# Patient Record
Sex: Male | Born: 1965 | Race: White | Hispanic: No | Marital: Married | Smoking: Current every day smoker
Health system: Southern US, Community
[De-identification: ages and names within clinical notes are randomized; demographics above are authoritative.]

## PROBLEM LIST (undated history)

## (undated) DIAGNOSIS — E785 Hyperlipidemia, unspecified: Secondary | ICD-10-CM

## (undated) DIAGNOSIS — F411 Generalized anxiety disorder: Secondary | ICD-10-CM

## (undated) DIAGNOSIS — Z87891 Personal history of nicotine dependence: Secondary | ICD-10-CM

## (undated) DIAGNOSIS — I498 Other specified cardiac arrhythmias: Secondary | ICD-10-CM

## (undated) DIAGNOSIS — I2510 Atherosclerotic heart disease of native coronary artery without angina pectoris: Secondary | ICD-10-CM

## (undated) DIAGNOSIS — M1990 Unspecified osteoarthritis, unspecified site: Secondary | ICD-10-CM

## (undated) DIAGNOSIS — E669 Obesity, unspecified: Secondary | ICD-10-CM

## (undated) DIAGNOSIS — M545 Low back pain: Secondary | ICD-10-CM

## (undated) DIAGNOSIS — K589 Irritable bowel syndrome without diarrhea: Secondary | ICD-10-CM

## (undated) DIAGNOSIS — Z9289 Personal history of other medical treatment: Secondary | ICD-10-CM

## (undated) HISTORY — DX: Irritable bowel syndrome, unspecified: K58.9

## (undated) HISTORY — DX: Atherosclerotic heart disease of native coronary artery without angina pectoris: I25.10

## (undated) HISTORY — DX: Unspecified osteoarthritis, unspecified site: M19.90

## (undated) HISTORY — DX: Low back pain: M54.5

## (undated) HISTORY — DX: Other specified cardiac arrhythmias: I49.8

## (undated) HISTORY — DX: Generalized anxiety disorder: F41.1

## (undated) HISTORY — PX: OTHER SURGICAL HISTORY: SHX169

---

## 2005-05-28 ENCOUNTER — Ambulatory Visit: Payer: Self-pay | Admitting: Pulmonary Disease

## 2007-06-02 ENCOUNTER — Ambulatory Visit: Payer: Self-pay | Admitting: Pulmonary Disease

## 2007-06-02 ENCOUNTER — Encounter: Payer: Self-pay | Admitting: Adult Health

## 2007-06-23 ENCOUNTER — Telehealth: Payer: Self-pay | Admitting: Adult Health

## 2007-06-23 DIAGNOSIS — K589 Irritable bowel syndrome without diarrhea: Secondary | ICD-10-CM

## 2007-06-23 DIAGNOSIS — F411 Generalized anxiety disorder: Secondary | ICD-10-CM

## 2007-06-23 DIAGNOSIS — M545 Low back pain: Secondary | ICD-10-CM | POA: Insufficient documentation

## 2007-06-23 DIAGNOSIS — K219 Gastro-esophageal reflux disease without esophagitis: Secondary | ICD-10-CM

## 2007-06-24 ENCOUNTER — Ambulatory Visit: Payer: Self-pay | Admitting: Pulmonary Disease

## 2007-08-13 ENCOUNTER — Ambulatory Visit (HOSPITAL_BASED_OUTPATIENT_CLINIC_OR_DEPARTMENT_OTHER): Admission: RE | Admit: 2007-08-13 | Discharge: 2007-08-13 | Payer: Self-pay | Admitting: Pulmonary Disease

## 2007-08-13 ENCOUNTER — Ambulatory Visit: Payer: Self-pay | Admitting: Pulmonary Disease

## 2007-08-26 ENCOUNTER — Ambulatory Visit: Payer: Self-pay | Admitting: Pulmonary Disease

## 2007-08-27 ENCOUNTER — Telehealth: Payer: Self-pay | Admitting: Pulmonary Disease

## 2007-09-03 ENCOUNTER — Ambulatory Visit: Payer: Self-pay | Admitting: Pulmonary Disease

## 2008-07-12 ENCOUNTER — Observation Stay (HOSPITAL_COMMUNITY): Admission: EM | Admit: 2008-07-12 | Discharge: 2008-07-13 | Payer: Self-pay | Admitting: Emergency Medicine

## 2008-07-12 ENCOUNTER — Telehealth (INDEPENDENT_AMBULATORY_CARE_PROVIDER_SITE_OTHER): Payer: Self-pay | Admitting: *Deleted

## 2008-07-16 ENCOUNTER — Telehealth: Payer: Self-pay | Admitting: Pulmonary Disease

## 2008-07-22 ENCOUNTER — Ambulatory Visit: Payer: Self-pay | Admitting: Pulmonary Disease

## 2008-07-25 DIAGNOSIS — M1990 Unspecified osteoarthritis, unspecified site: Secondary | ICD-10-CM | POA: Insufficient documentation

## 2008-07-25 DIAGNOSIS — G479 Sleep disorder, unspecified: Secondary | ICD-10-CM

## 2008-07-25 DIAGNOSIS — J209 Acute bronchitis, unspecified: Secondary | ICD-10-CM

## 2008-07-25 DIAGNOSIS — R079 Chest pain, unspecified: Secondary | ICD-10-CM | POA: Insufficient documentation

## 2008-07-25 DIAGNOSIS — F17200 Nicotine dependence, unspecified, uncomplicated: Secondary | ICD-10-CM

## 2010-06-08 DIAGNOSIS — M545 Low back pain, unspecified: Secondary | ICD-10-CM

## 2010-06-08 HISTORY — DX: Low back pain, unspecified: M54.50

## 2010-07-07 ENCOUNTER — Inpatient Hospital Stay (HOSPITAL_COMMUNITY)
Admission: EM | Admit: 2010-07-07 | Discharge: 2010-07-12 | Payer: Self-pay | Source: Home / Self Care | Attending: Cardiology | Admitting: Cardiology

## 2010-07-08 ENCOUNTER — Encounter: Payer: Self-pay | Admitting: Cardiology

## 2010-07-09 HISTORY — PX: CORONARY STENT PLACEMENT: SHX1402

## 2010-07-12 LAB — CBC
HCT: 41.7 % (ref 39.0–52.0)
Hemoglobin: 14.3 g/dL (ref 13.0–17.0)
MCH: 30.3 pg (ref 26.0–34.0)
MCHC: 34.3 g/dL (ref 30.0–36.0)
MCV: 88.3 fL (ref 78.0–100.0)
Platelets: 202 10*3/uL (ref 150–400)
RBC: 4.72 MIL/uL (ref 4.22–5.81)
RDW: 12.6 % (ref 11.5–15.5)
WBC: 10.1 10*3/uL (ref 4.0–10.5)

## 2010-07-12 LAB — BASIC METABOLIC PANEL
BUN: 7 mg/dL (ref 6–23)
CO2: 27 mEq/L (ref 19–32)
Calcium: 9.3 mg/dL (ref 8.4–10.5)
Chloride: 105 mEq/L (ref 96–112)
Creatinine, Ser: 1.14 mg/dL (ref 0.4–1.5)
GFR calc Af Amer: >60 mL/min (ref >60)
GFR calc non Af Amer: >60 mL/min (ref >60)
Glucose, Bld: 100 mg/dL — ABNORMAL HIGH (ref 70–99)
Potassium: 4.2 mEq/L (ref 3.5–5.1)
Sodium: 140 mEq/L (ref 135–145)

## 2010-07-12 LAB — HEPARIN LEVEL (UNFRACTIONATED): Heparin Unfractionated: <0.1 IU/mL — ABNORMAL LOW (ref 0.30–0.70)

## 2010-07-27 ENCOUNTER — Ambulatory Visit
Admission: RE | Admit: 2010-07-27 | Discharge: 2010-07-27 | Payer: Self-pay | Source: Home / Self Care | Attending: Physician Assistant | Admitting: Physician Assistant

## 2010-07-27 ENCOUNTER — Encounter: Payer: Self-pay | Admitting: Cardiovascular Disease

## 2010-07-27 ENCOUNTER — Telehealth: Payer: Self-pay | Admitting: Pulmonary Disease

## 2010-07-27 DIAGNOSIS — I498 Other specified cardiac arrhythmias: Secondary | ICD-10-CM

## 2010-07-27 DIAGNOSIS — I252 Old myocardial infarction: Secondary | ICD-10-CM | POA: Insufficient documentation

## 2010-07-27 DIAGNOSIS — I2510 Atherosclerotic heart disease of native coronary artery without angina pectoris: Secondary | ICD-10-CM | POA: Insufficient documentation

## 2010-07-27 HISTORY — DX: Other specified cardiac arrhythmias: I49.8

## 2010-08-01 ENCOUNTER — Telehealth: Payer: Self-pay | Admitting: Cardiology

## 2010-08-02 NOTE — Discharge Summary (Addendum)
NAME:  George [name], George [name] NO.:  [id number]  MEDICAL RECORD NO.:  [id number]          PATIENT TYPE:  INP  LOCATION:  6523                         FACILITY:  [facility]  PHYSICIAN:  Verne Carrow, MDDATE OF BIRTH:  1966-07-03  DATE OF ADMISSION:  07/07/2010 DATE OF DISCHARGE:  07/12/2010                              DISCHARGE SUMMARY   PRIMARY CARDIOLOGIST:  [name], MD  DISCHARGE DIAGNOSIS:  Non-ST-segment elevation myocardial infarction.  SECONDARY DIAGNOSES: 1. Coronary artery disease status post drug-eluting stent placement to     the mid left circumflex this admission. 2. Hyperlipidemia. 3. Ongoing tobacco abuse. 4. Degenerative disk disease with chronic low back pain. 5. History of bilateral knee surgeries.  ALLERGIES:  TETANUS TOXOID.  PROCEDURES: 1. Left heart cardiac catheterization revealing 60% mid LAD stenosis,     99% mid left circumflex stenosis, 60% to 70% distal circumflex     stenosis, 60% stenosis in the right posterolateral branch (1.75-mm     vessel).  The circumflex was stented with a 2.5- x 20-mm Promus     Element Plus drug-eluting stent.  EF was 55%. 2. July 08, 2010, 2-D echocardiogram, EF 60% to 65% with normal     wall motion and moderate LVH.  Normal valvular function.  HISTORY OF PRESENT ILLNESS:  A 45 year old male with prior history of chest pain and negative stress testing in January 2010 who was in his usual state of health until the evening of July 07, 2010, when he had sudden onset of left-sided substernal chest discomfort with radiation down the left arm.  He believes symptoms were initially made better by over-the-counter antacid but then had recurrence of symptoms prompting presentation to the [facility] ED.  In the ED, he was found to have a troponin of 0.22, although ECG showed no acute changes.  He was admitted for further evaluation and management of non-ST-elevation MI.  HOSPITAL COURSE:  The  patient was loaded with 600 mg of Plavix and placed on 75 mg daily as well as aspirin, beta-blocker, heparin, and statin therapy.  The patient eventually peaked his CK at 883, MB at 140.4, and troponin-I at 13.20.  On the evening of July 09, 2009, the patient complained of severe low back pain.  Internal Medicine was consulted for recommendation and they recommended CT of the lumbar spine which showed mild multilevel degenerative changes, most severe at T12-L1.  There was severe facet arthropathy noted at the right L4-L5.  Internal Medicine recommended continued pain management and possible surgical referral once cardiology issues were cleared.  Back pain improved with pain medications including Vicodin and Robaxin. The patient was taken to the cath lab on July 11, 2010, where he underwent diagnostic catheterization revealing 99% stenosis of the mid left circumflex.  He had moderate diffuse nonobstructive disease in the LAD, circumflex, and a small branch of the right coronary artery also. EF was 55%.  It was felt that the circumflex was the culprit vessel and this was successfully treated with a 2.5- x 20-mm Promus Element Plus drug-eluting stent.  The patient tolerated this procedure well and postprocedure has  not had any recurrent symptoms or limitations.  He has been counseled on the importance of smoking cessation and has been ambulating without difficulty.  Given residual LAD disease of 60% in the mid LAD, we do recommend outpatient Myoview in approximately 2-3 months to assess for possible ischemic burden.  The patient will be discharged home today in good condition.  DISCHARGE LABORATORY DATA:  Hemoglobin 14.3, hematocrit 41.7, WBC 10.1, platelets 202,000, INR 0.91.  Sodium 140, potassium 4.2, chloride 105, CO2 27, BUN 7, creatinine 0.14, glucose 100, calcium 9.3, magnesium 2.3. Hemoglobin A1c 5.5.  CK 590, MB 75.9, troponin-I 9.57.  Total cholesterol 194, triglycerides  210, HDL 33, LDL 119.  TSH 1.639.  MRSA screen was negative.  DISPOSITION:  The patient will be discharged home today in good condition.  FOLLOWUP PLANS AND APPOINTMENTS:  We have arranged for follow up with Tereso Newcomer, PA, in our [facility] office on July 26, 2009, at 10:30 a.m.  He will follow up with Dr. Shirlee Latch in approximately 3 months.  DISCHARGE MEDICATIONS: 1. Aspirin 325 mg daily. 2. Effient 10 mg daily. 3. Hydrocodone/APAP 5/325 mg q.8 h. p.r.n. 4. Lopressor 25 mg b.i.d. 5. Robaxin 500 mg q.i.d. 6. Nitroglycerin 0.4 mg sublingual p.r.n. chest pain. 7. Simvastatin 40 mg at bedtime.  OUTSTANDING LABORATORY DATA AND STUDIES:  Lexiscan Myoview recommended in 2-3 months to determine ischemic burden of LAD disease.  Follow up lipids LFTs in 6-8 weeks given new statin therapy.  DURATION OF DISCHARGE ENCOUNTER:  Forty five minutes including physician time.     Nicolasa Ducking, ANP   ______________________________ Verne Carrow, MD    CB/MEDQ  D:  07/12/2010  T:  07/12/2010  Job:  [id number]  Electronically Signed by Nicolasa Ducking ANP on 07/31/2010 03:42:58 PM Electronically Signed by Verne Carrow MD on 08/02/2010 04:10:09 PM

## 2010-08-10 NOTE — Assessment & Plan Note (Addendum)
Summary: eph per chris/lg   History of Present Illness: Primary Cardiologist:  Dr. [name]  Del Overfelt is a 45 yo male smoker with a h/o hyperlipidemia and sleep apnea and a negative myoview in 2010 who presented to [facility] 07/07/10 with chest pain.  He ruled in for NSTEMI.  Cath demonstrated a 99% CFX lesion that was treated with a DES.  Cath also demonstrated pLAD 60% stenosis.  It is rec. the patient have a follow up myoview in 2-3 mos. to assess for ischemia in the LAD territory.  He did have a CT scan done in the hospital due to complaints of back pain and this demonstrated multilevel DDD and DJD and he was treated with pain medications.  He is doing well.  He denies exertional chest pain or dyspnea.  He does take alprazolam for anxiety.  He's been added that for a while now.  He does note that if he gets anxious or upset he will get chest pressure.  Prior to his heart attack, he was having dyspnea with exertion.  This is all resolved.  He denies orthopnea or PND.  He denies pedal edema.  Denies syncope or palpitations.  He is taking his medications.  Current Medications (verified): 1)  Adult Aspirin Low Strength 81 Mg Tbdp (Aspirin) .... Take 1 Tablet By Mouth Once A Day 2)  Nitrostat 0.4 Mg Subl (Nitroglycerin) .... As Needed For Chest Pain 3)  Effient 10 Mg Tabs (Prasugrel Hcl) .Marland Kitchen.. 1 Once Daily 4)  Lopressor 50 Mg Tabs (Metoprolol Tartrate) .... 1/2 Tab Two Times A Day 5)  Robaxin 500 Mg Tabs (Methocarbamol) .Marland Kitchen.. 1 Tab Four Times A Day 6)  Simvastatin 40 Mg Tabs (Simvastatin) .Marland Kitchen.. 1 Once Daily 7)  Hydrocodone-Acetaminophen 5-325 Mg Tabs (Hydrocodone-Acetaminophen) .Marland Kitchen.. 1 Q8 Hours As Needed For Pain  Allergies (verified): 1)  ! * Anaprox 2)  ! * Tetanus  Past History:  Past Medical History: CAD  a. s/p NSTEMI 06/2010: tx with DES to CFX  b. cath 07/11/10: pLAD 60% (needs myoview to assess for isch); pCFX 30%, m99% (PCI) then 60%;       PLB 60% (small); EF 55% Echo 06/2010: EF  60-65%; mod LVH SLEEP DISORDER (ICD-780.50) Hx of ASTHMATIC BRONCHITIS, ACUTE (ICD-466.0) CIGARETTE SMOKER (ICD-305.1) Hx of CHEST PAIN (ICD-786.50) GERD (ICD-530.81) IRRITABLE BOWEL SYNDROME (ICD-564.1) DEGENERATIVE JOINT DISEASE (ICD-715.90) BACK PAIN, LUMBAR (ICD-724.2) ANXIETY (ICD-300.00)  Review of Systems       As per  the HPI.  All other systems reviewed and negative.   Vital Signs:  Patient profile:   45 year old male Height:      72 inches Weight:      229.75 pounds BMI:     31.27 Pulse rate:   43 / minute BP sitting:   116 / 82  (right arm) Cuff size:   regular  Vitals Entered By: Micki Riley CNA (July 27, 2010 11:50 AM)  Physical Exam  General:  Well nourished, well developed, in no acute distress HEENT: normal Neck: no JVD Cardiac:  normal S1, S2; RRR; no murmur Lungs:  clear to auscultation bilaterally, no wheezing, rhonchi or rales Abd: soft, nontender, no hepatomegaly Ext: no edema; R radial site without hematoma or bruit Vascular: no carotid  bruits Skin: warm and dry Neuro:  CNs 2-12 intact, no focal abnormalities noted    EKG  Procedure date:  07/27/2010  Findings:      sinus bradycardia Heart rate 43 Leftward axis Incomplete right bundle  branch block No ischemic changes  Impression & Recommendations:  Problem # 1:  MYOCARDIAL INFARCTION, ACUTE, SUBENDOCARDIAL (ICD-410.70) He is doing well post myocardial infarction.  He will continue on aspirin and Effient.  He is bradycardic with the metoprolol and I will decrease this to 12.5 mg twice a day.  He is unable to attend cardiac rehabilitation due to finances.  I've encouraged him to continue to increase his walking at home.  Problem # 2:  CORONARY ATHEROSCLEROSIS NATIVE CORONARY ARTERY (ICD-414.01) He notes that he is having some discomfort with anxiety.  I've encouraged him to get a refill for his alprazolam with Dr. Kriste Basque.  I reviewed his catheterization with Dr. Clifton Ranny who did his  intervention.  At this point, he felt that we should keep an eye on his 60% LAD lesion.  It did not appear to be critical at his cardiac catheterization.  The patient will be continued on medical therapy and we'll bring him back in 2 months to see Dr. Shirlee Latch.  Timing of when to proceed with his Myoview study will be decided on at that visit.  Orders: EKG w/ Interpretation (93000)  Problem # 3:  HYPERLIPIDEMIA (ICD-272.4) Check lipids and LFTs in 6-8 weeks.  Problem # 4:  BRADYCARDIA (ICD-427.89) He is fairly asymptomatic.  However his heart rates are in the 40s.  I have decreased his Lopressor to 12.5 mg  Problem # 5:  ANXIETY (ICD-300.00) F/u with Dr. Kriste Basque.  Problem # 6:  BACK PAIN, LUMBAR (ICD-724.2) F/u with Dr.  Kriste Basque.  Patient Instructions: 1)  Your physician recommends that you schedule a follow-up appointment in: 2 MONTHS WITH DR [facility] 2)  Your physician recommends that you return for lab work in: 6-8 WEEKS FASTING  LIPID LIVER 272.4 V58.69 3)  Your physician has recommended you make the following change in your medication: DECREASE METORPOLOL TO 12.5 MG two times a day  4)  CALL DR NADEL'S OFFICE TO GET REFILL ON GEN XANAX 5)  CONTINUE  EFFIENT  AND ASPIRIN

## 2010-08-10 NOTE — Progress Notes (Signed)
Summary: pt has med questions  Phone Note Call from Patient   Caller: Patient [phone] Reason for Call: Talk to Nurse Summary of Call: pt has questions re med Initial call taken by: Glynda Jaeger,  August 01, 2010 10:06 AM  Follow-up for Phone Call        Pt. would like to know if he needs to take 81 mg or 325 mg asprin daily. Pt. was D/c from the hospital with 325 mg asprin once a day. Tereso Newcomer aware he recommends for pt. to continue taken 325 mg asprin until 08/12/10 then start 81 mg there after. Pt. to continue taken Effient. Pt. aware. He verbalized understanding. Follow-up by: Ollen Gross, RN, BSN,  August 01, 2010 10:59 AM

## 2010-08-10 NOTE — Progress Notes (Signed)
Summary: rx request  Phone Note Call from Patient Call back at Home Phone [phone]   Caller: George [name] Call For: Aniyiah Zell Summary of Call: wants rx for xanax. [facility] in [place] [phone] Initial call taken by: Tivis Ringer, CNA,  July 27, 2010 2:05 PM  Follow-up for Phone Call        called and spoke with pt---he stated that he recently had MI---he is requesting refills of the xanax 0.25mg ---i didnt see this on his list but he stated that SN has given this med to him before---he is feeling very stressed about not being able to work right now---please advise if ok to call in med.  thanks Randell Loop CMA  July 27, 2010 2:16 PM    last ov- with SN---07/2008  Additional Follow-up for Phone Call Additional follow up Details #1::        per SN---ok to call in the alprazolam 0.25mg   #100  take one tablet by mouth three times a day as needed for nerves with no refills.   pt will need ov for further refills--called and spoke with pt and he is aware of meds sent to the pharmacy---he will call back for appt Randell Loop CMA  July 27, 2010 3:42 PM     New/Updated Medications: ALPRAZOLAM 0.25 MG TABS (ALPRAZOLAM) take one tablet by mouth three times a day as needed for nerves Prescriptions: ALPRAZOLAM 0.25 MG TABS (ALPRAZOLAM) take one tablet by mouth three times a day as needed for nerves  #100 x 0   Entered by:   Randell Loop CMA   Authorized by:   Michele Mcalpine MD   Signed by:   Randell Loop CMA on 07/27/2010   Method used:   Telephoned to ...       [facility]  [address] [facility] (retail)       [address]       [place], [place]  [address]       Ph: [phone]       Fax: [phone]   RxID:   [id number]

## 2010-09-14 ENCOUNTER — Other Ambulatory Visit: Payer: Self-pay | Admitting: Physician Assistant

## 2010-09-14 ENCOUNTER — Encounter: Payer: Self-pay | Admitting: Physician Assistant

## 2010-09-14 ENCOUNTER — Other Ambulatory Visit (INDEPENDENT_AMBULATORY_CARE_PROVIDER_SITE_OTHER): Payer: Self-pay

## 2010-09-14 DIAGNOSIS — I2510 Atherosclerotic heart disease of native coronary artery without angina pectoris: Secondary | ICD-10-CM

## 2010-09-14 DIAGNOSIS — R079 Chest pain, unspecified: Secondary | ICD-10-CM

## 2010-09-14 DIAGNOSIS — E785 Hyperlipidemia, unspecified: Secondary | ICD-10-CM

## 2010-09-14 LAB — HEPATIC FUNCTION PANEL
ALT: 36 U/L (ref 0–53)
AST: 19 U/L (ref 0–37)
Albumin: 4.1 g/dL (ref 3.5–5.2)
Alkaline Phosphatase: 81 U/L (ref 39–117)
Bilirubin, Direct: 0.2 mg/dL (ref 0.0–0.3)
Total Bilirubin: 0.7 mg/dL (ref 0.3–1.2)
Total Protein: 6.6 g/dL (ref 6.0–8.3)

## 2010-09-14 LAB — LIPID PANEL
Cholesterol: 159 mg/dL (ref 0–200)
HDL: 32.2 mg/dL — ABNORMAL LOW (ref >39.00)
LDL Cholesterol: 93 mg/dL (ref 0–99)
Total CHOL/HDL Ratio: 5
Triglycerides: 170 mg/dL — ABNORMAL HIGH (ref 0.0–149.0)
VLDL: 34 mg/dL (ref 0.0–40.0)

## 2010-09-18 LAB — CARDIAC PANEL(CRET KIN+CKTOT+MB+TROPI)
CK, MB: 123.5 ng/mL (ref 0.3–4.0)
CK, MB: 75.9 ng/mL (ref 0.3–4.0)
Relative Index: 12.9 — ABNORMAL HIGH (ref 0.0–2.5)
Relative Index: 15.5 — ABNORMAL HIGH (ref 0.0–2.5)
Relative Index: 15.9 — ABNORMAL HIGH (ref 0.0–2.5)
Total CK: 799 U/L — ABNORMAL HIGH (ref 7–232)
Troponin I: 11.44 ng/mL (ref 0.00–0.06)
Troponin I: 9.57 ng/mL (ref 0.00–0.06)

## 2010-09-18 LAB — CBC
HCT: 42.1 % (ref 39.0–52.0)
HCT: 44.4 % (ref 39.0–52.0)
Hemoglobin: 13.2 g/dL (ref 13.0–17.0)
Hemoglobin: 14.4 g/dL (ref 13.0–17.0)
Hemoglobin: 15.3 g/dL (ref 13.0–17.0)
MCH: 29.8 pg (ref 26.0–34.0)
MCH: 30 pg (ref 26.0–34.0)
MCH: 30 pg (ref 26.0–34.0)
MCH: 30.2 pg (ref 26.0–34.0)
MCH: 30.2 pg (ref 26.0–34.0)
MCHC: 33.4 g/dL (ref 30.0–36.0)
MCHC: 33.6 g/dL (ref 30.0–36.0)
MCHC: 33.9 g/dL (ref 30.0–36.0)
MCHC: 34.2 g/dL (ref 30.0–36.0)
MCHC: 34.5 g/dL (ref 30.0–36.0)
MCV: 87.6 fL (ref 78.0–100.0)
MCV: 88.3 fL (ref 78.0–100.0)
Platelets: 177 10*3/uL (ref 150–400)
Platelets: 195 10*3/uL (ref 150–400)
Platelets: 206 10*3/uL (ref 150–400)
Platelets: 220 10*3/uL (ref 150–400)
RBC: 4.67 MIL/uL (ref 4.22–5.81)
RBC: 4.77 MIL/uL (ref 4.22–5.81)
RBC: 5.07 MIL/uL (ref 4.22–5.81)
RDW: 12.6 % (ref 11.5–15.5)
RDW: 12.7 % (ref 11.5–15.5)
RDW: 12.7 % (ref 11.5–15.5)
RDW: 12.8 % (ref 11.5–15.5)
WBC: 11.9 10*3/uL — ABNORMAL HIGH (ref 4.0–10.5)
WBC: 12.6 10*3/uL — ABNORMAL HIGH (ref 4.0–10.5)

## 2010-09-18 LAB — BASIC METABOLIC PANEL
BUN: 10 mg/dL (ref 6–23)
BUN: 11 mg/dL (ref 6–23)
BUN: 8 mg/dL (ref 6–23)
CO2: 24 mEq/L (ref 19–32)
CO2: 26 mEq/L (ref 19–32)
CO2: 26 mEq/L (ref 19–32)
CO2: 27 mEq/L (ref 19–32)
Calcium: 8.9 mg/dL (ref 8.4–10.5)
Calcium: 8.9 mg/dL (ref 8.4–10.5)
Calcium: 9.1 mg/dL (ref 8.4–10.5)
Calcium: 9.1 mg/dL (ref 8.4–10.5)
Calcium: 9.2 mg/dL (ref 8.4–10.5)
Chloride: 106 mEq/L (ref 96–112)
Chloride: 108 mEq/L (ref 96–112)
Creatinine, Ser: 0.98 mg/dL (ref 0.4–1.5)
Creatinine, Ser: 1 mg/dL (ref 0.4–1.5)
Creatinine, Ser: 1.02 mg/dL (ref 0.4–1.5)
Creatinine, Ser: 1.04 mg/dL (ref 0.4–1.5)
Creatinine, Ser: 1.06 mg/dL (ref 0.4–1.5)
GFR calc Af Amer: >60 mL/min (ref >60)
GFR calc Af Amer: >60 mL/min (ref >60)
GFR calc Af Amer: >60 mL/min (ref >60)
GFR calc non Af Amer: >60 mL/min (ref >60)
GFR calc non Af Amer: >60 mL/min (ref >60)
GFR calc non Af Amer: >60 mL/min (ref >60)
Glucose, Bld: 109 mg/dL — ABNORMAL HIGH (ref 70–99)
Glucose, Bld: 111 mg/dL — ABNORMAL HIGH (ref 70–99)
Glucose, Bld: 147 mg/dL — ABNORMAL HIGH (ref 70–99)
Potassium: 3.9 mEq/L (ref 3.5–5.1)
Potassium: 4.3 mEq/L (ref 3.5–5.1)
Sodium: 136 mEq/L (ref 135–145)
Sodium: 139 mEq/L (ref 135–145)
Sodium: 139 mEq/L (ref 135–145)

## 2010-09-18 LAB — HEPARIN LEVEL (UNFRACTIONATED)
Heparin Unfractionated: 0.16 IU/mL — ABNORMAL LOW (ref 0.30–0.70)
Heparin Unfractionated: 0.39 IU/mL (ref 0.30–0.70)
Heparin Unfractionated: <0.1 IU/mL — ABNORMAL LOW (ref 0.30–0.70)

## 2010-09-18 LAB — DIFFERENTIAL
Basophils Absolute: 0.1 10*3/uL (ref 0.0–0.1)
Basophils Relative: 1 % (ref 0–1)
Eosinophils Absolute: 0.4 10*3/uL (ref 0.0–0.7)
Eosinophils Relative: 3 % (ref 0–5)
Lymphocytes Relative: 17 % (ref 12–46)
Lymphs Abs: 2.1 10*3/uL (ref 0.7–4.0)
Monocytes Absolute: 1 10*3/uL (ref 0.1–1.0)
Monocytes Relative: 8 % (ref 3–12)
Neutro Abs: 9 10*3/uL — ABNORMAL HIGH (ref 1.7–7.7)
Neutrophils Relative %: 72 % (ref 43–77)

## 2010-09-18 LAB — CK TOTAL AND CKMB (NOT AT ARMC)
CK, MB: 11.6 ng/mL (ref 0.3–4.0)
Relative Index: 7.3 — ABNORMAL HIGH (ref 0.0–2.5)
Total CK: 159 U/L (ref 7–232)

## 2010-09-18 LAB — LIPID PANEL
HDL: 33 mg/dL — ABNORMAL LOW (ref >39)
LDL Cholesterol: 119 mg/dL — ABNORMAL HIGH (ref 0–99)
Triglycerides: 210 mg/dL — ABNORMAL HIGH (ref <150)

## 2010-09-18 LAB — POCT CARDIAC MARKERS
CKMB, poc: 1.8 ng/mL (ref 1.0–8.0)
CKMB, poc: 4.9 ng/mL (ref 1.0–8.0)
Myoglobin, poc: 129 ng/mL (ref 12–200)
Myoglobin, poc: 236 ng/mL (ref 12–200)
Troponin i, poc: 0.22 ng/mL — ABNORMAL HIGH (ref 0.00–0.09)
Troponin i, poc: 0.26 ng/mL (ref 0.00–0.09)

## 2010-09-18 LAB — HEMOGLOBIN A1C
Hgb A1c MFr Bld: 5.5 % (ref <5.7)
Mean Plasma Glucose: 111 mg/dL (ref <117)

## 2010-09-18 LAB — TSH: TSH: 1.639 u[IU]/mL (ref 0.350–4.500)

## 2010-09-18 LAB — BRAIN NATRIURETIC PEPTIDE: Pro B Natriuretic peptide (BNP): 45 pg/mL (ref 0.0–100.0)

## 2010-09-18 LAB — TROPONIN I: Troponin I: 0.87 ng/mL (ref 0.00–0.06)

## 2010-09-18 LAB — MRSA PCR SCREENING: MRSA by PCR: NEGATIVE

## 2010-09-18 LAB — LIPASE, BLOOD: Lipase: 23 U/L (ref 11–59)

## 2010-09-20 ENCOUNTER — Ambulatory Visit: Payer: Self-pay | Admitting: Cardiology

## 2010-10-23 LAB — COMPREHENSIVE METABOLIC PANEL
Albumin: 3.9 g/dL (ref 3.5–5.2)
BUN: 10 mg/dL (ref 6–23)
Chloride: 103 mEq/L (ref 96–112)
Creatinine, Ser: 1.17 mg/dL (ref 0.4–1.5)
GFR calc non Af Amer: >60 mL/min (ref >60)
Glucose, Bld: 83 mg/dL (ref 70–99)
Total Bilirubin: 0.6 mg/dL (ref 0.3–1.2)

## 2010-10-23 LAB — PROTIME-INR
INR: 0.9 (ref 0.00–1.49)
Prothrombin Time: 12.2 seconds (ref 11.6–15.2)

## 2010-10-23 LAB — CK TOTAL AND CKMB (NOT AT ARMC)
CK, MB: 1.1 ng/mL (ref 0.3–4.0)
Relative Index: 1.1 (ref 0.0–2.5)
Total CK: 102 U/L (ref 7–232)

## 2010-10-23 LAB — LIPID PANEL
Cholesterol: 206 mg/dL — ABNORMAL HIGH (ref 0–200)
HDL: 30 mg/dL — ABNORMAL LOW (ref >39)
LDL Cholesterol: 141 mg/dL — ABNORMAL HIGH (ref 0–99)
Triglycerides: 174 mg/dL — ABNORMAL HIGH (ref <150)

## 2010-10-23 LAB — DIFFERENTIAL
Basophils Relative: 1 % (ref 0–1)
Eosinophils Absolute: 0.4 10*3/uL (ref 0.0–0.7)
Neutrophils Relative %: 64 % (ref 43–77)

## 2010-10-23 LAB — POCT I-STAT, CHEM 8
HCT: 47 % (ref 39.0–52.0)
Hemoglobin: 16 g/dL (ref 13.0–17.0)
Potassium: 4 mEq/L (ref 3.5–5.1)
Sodium: 140 mEq/L (ref 135–145)

## 2010-10-23 LAB — POCT CARDIAC MARKERS
CKMB, poc: <1 ng/mL — ABNORMAL LOW (ref 1.0–8.0)
CKMB, poc: <1 ng/mL — ABNORMAL LOW (ref 1.0–8.0)
Myoglobin, poc: 60.3 ng/mL (ref 12–200)

## 2010-10-23 LAB — CBC
MCHC: 34.8 g/dL (ref 30.0–36.0)
MCV: 87.4 fL (ref 78.0–100.0)
Platelets: 233 10*3/uL (ref 150–400)
RDW: 12.9 % (ref 11.5–15.5)
WBC: 8 10*3/uL (ref 4.0–10.5)

## 2010-10-23 LAB — CARDIAC PANEL(CRET KIN+CKTOT+MB+TROPI)
Relative Index: 0.9 (ref 0.0–2.5)
Relative Index: INVALID (ref 0.0–2.5)

## 2010-10-23 LAB — HOMOCYSTEINE: Homocysteine: 13.3 umol/L (ref 4.0–15.4)

## 2010-10-23 LAB — APTT: aPTT: 30 seconds (ref 24–37)

## 2010-10-24 ENCOUNTER — Encounter: Payer: Self-pay | Admitting: Cardiology

## 2010-11-21 NOTE — Procedures (Signed)
NAME:  George [name], George [name] NO.:  [id number]   MEDICAL RECORD NO.:  [id number]          PATIENT TYPE:  OUT   LOCATION:  SLEEP CENTER                 FACILITY:  [facility]   PHYSICIAN:  Barbaraann Share, MD,FCCPDATE OF BIRTH:  1965-08-26   DATE OF STUDY:  08/13/2007                            NOCTURNAL POLYSOMNOGRAM   REFERRING PHYSICIAN:   INDICATION FOR STUDY:  Hypersomnia with sleep apnea.   EPWORTH SLEEPINESS SCORE:  Nine.   SLEEP ARCHITECTURE:  The patient had total sleep time of 364 minutes  with only 69 minutes of REM and no slow wave sleep.  Sleep onset latency  was normal and REM onset was normal as well. Sleep efficiency was  excellent at 96%.   RESPIRATORY DATA:  The patient was found to have one obstructive apnea  and three obstructive hypopneas for an apnea/hypopnea index of 0.7  events per hour.  However, he was also found to have 32 respiratory  effort related arousals giving him a respiratory disturbance index of 6  events per hour.  The events all occurred in the supine position, and it  should be noted the patient had very little supine sleep during the  night.  Loud snoring was noted.   OXYGEN DATA:  There was O2 desaturation transiently to 90% with the  patient's obstructive events.   CARDIAC DATA:  Rare PAC and PVC, but no clinically significant  arrhythmia.   MOVEMENT-PARASOMNIA:  None.   IMPRESSIONS-RECOMMENDATIONS:  1. Mild obstructive sleep apnea/hypopnea syndrome with an      apnea/hypopnea index of only 0.7 events per hour.  However, the      patient had a respiratory disturbance index of 6 events per hour      counting his RERAs..  It should be noted that his events occurred      all during supine sleep, and that he had decreased supine sleep      during the night.  Therefore his degree of sleep disordered      breathing may be underestimated if the patient commonly sleeps      supine when at home.  There was O2 desaturation as low as  90%.      Treatment for this degree of sleep apnea can include weight loss      alone if applicable, upper airway surgery, oral appliance and also      CPAP.      Clinical correlation is suggested.  2. Rare PAC and PVC with no clinically significant arrhythmia noted.      Barbaraann Share, MD,FCCP  Diplomate, American Board of Sleep  Medicine  Electronically Signed     [facility]/MEDQ  D:  08/27/2007 08:19:40  T:  08/27/2007 15:50:00  Job:  [id number]

## 2010-11-21 NOTE — Discharge Summary (Signed)
NAME:  George [name], George [name] NO.:  [id number]   MEDICAL RECORD NO.:  [id number]          PATIENT TYPE:  INP   LOCATION:  6525                         FACILITY:  [facility]   PHYSICIAN:  Mohan N. Sharyn Lull, M.D. DATE OF BIRTH:  Dec 25, 1965   DATE OF ADMISSION:  07/12/2008  DATE OF DISCHARGE:  07/13/2008                               DISCHARGE SUMMARY   ADMITTING DIAGNOSES:  1. Recurrent chest pain, rule out coronary insufficiency.  2. Tobacco abuse.  3. Positive family history of coronary artery disease.  4. Anxiety disorder.   FINAL DIAGNOSES:  1. Status post chest pain, myocardial infarction ruled out, negative      stress Myoview.  2. Anxiety disorder.  3. Tobacco abuse.  4. Positive family history of coronary artery disease.  5. Hypercholesterolemia.   DISCHARGE HOME MEDICATIONS:  1. Enteric-coated aspirin 81 mg 1 tablet daily.  2. Protonix 40 mg 1 tablet daily.  3. Xanax 0.25 mg 1 tablet twice daily.  4. Simcor 500/20, 1 tablet daily at night.  5. Nitrostat 0.4 mg sublingual, use as directed.   DIET:  Low salt, low cholesterol.   ACTIVITY:  As tolerated.   FOLLOWUP:  Followup with Dr. Kriste Basque in 1 week.  Followup with me in 2  weeks.   CONDITION AT DISCHARGE:  Stable.   BRIEF HISTORY AND HOSPITAL COURSE:  [name] is a 45 year old white  male with past medical history significant for tobacco abuse, positive  family history of coronary artery disease who came to ER complaining of  retrosternal chest pressure off and on, grade 2/10, radiating  occasionally to left shoulder, associated with shortness of breath;  palpitation; and feeling week and fatigue.  Denies any nausea, vomiting,  or diaphoresis.  Denies PND, orthopnea, or leg swelling.  The patient  also gives history of exertional chest pain associated with feeling  weak, relieves with rest.  Denies any chest pain at present.  Denies any  cardiac workup in the past.   PAST MEDICAL HISTORY:  As above.   PAST SURGICAL HISTORY:  He had bilateral knee surgeries in the past.   ALLERGIES:  He is allergic to TETANUS TOXOID.   MEDICATIONS AT HOME:  None.   SOCIAL HISTORY:  He is married, has 1 daughter, smokes 1-1/2 packs per  day for 30 plus years.  No history alcohol abuse.  No drugs.  Works for  [employer].  States he comes in contact with fumes.   FAMILY HISTORY:  Father is alive, he has coronary artery disease, had  MIs x2.  Mother is alive, she had 4 MIs and 3 stents in the past.  One  brother in good health.   PHYSICAL EXAMINATION:  GENERAL:  He is alert, awake, oriented x3, in no  acute distress.  VITAL SIGNS:  Blood pressure is 124/71, pulse is 56 and regular.  HEENT:  Conjunctiva is pink.  NECK:  Supple.  No JVD.  No bruits.  LUNGS:  Clear to auscultation without rhonchi, rales, or wheezing.  CARDIOVASCULAR:  S1 and S2 is normal.  There  is a soft systolic murmur.  ABDOMEN:  Soft, bowel sounds are present, nontender.  EXTREMITIES:  There is no clubbing, cyanosis, or edema.   LABORATORY DATA:  EKG showed normal sinus rhythm with no acute ischemic  changes.  Chest x-ray was normal with no evidence of cardiopulmonary  disease.  Hemoglobin was 15.7, hematocrit 45.1, white count of 8.0.  His  potassium was 4.0, BUN 10, creatinine 1.2, glucose 98.  Two sets of  cardiac enzymes were normal.  His cholesterol was 206, LDL 141, HDL 30.  His stress Myoview scan showed no evidence of reversible ischemia with  EF of 55%.   BRIEF HOSPITAL COURSE:  The patient was admitted to telemetry unit.  MI  was ruled out by serial enzymes and EKG.  The patient subsequently  underwent stress Myoview exercise from 5.50 minute on Bruce protocol,  complained of fatigue and shortness of breath.  Peak heart rate achieved  was 153 beats per minute, which was approximately 87% of maximum  predicted heart rate.  Peak blood pressure was 143/84.  EKG at baseline  showed sinus bradycardia.  No  acute ischemic changes were noted.  Occasional nonconducted APCs and PVCs were noted in the recovery phase.  Myoview scan showed no evidence of ischemia with EF of 55%.  Tobacco  cessation consult was obtained as the patient smokes 1-1/2 to 2 packs  every day for 30 plus years.  I discussed with the patient regarding  smoking cessation and Chantix.  The patient will discuss with his  primary MD and also will talk to MD regarding seeing the pulmonologist,  as he has come in contact with fumes and approximately 50 plus years of  tobacco abuse.  The patient will be discharged home today and will be  followed up in my office in 2 weeks.  If the patient continues to have  recurrent chest pressure, then we may consider outpatient cath.      [name]. Sharyn Lull, M.D.  Electronically Signed     [facility]/MEDQ  D:  07/13/2008  T:  07/14/2008  Job:  [id number]   cc:   Lonzo Cloud. Kriste Basque, MD
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
## 2011-01-01 ENCOUNTER — Encounter: Payer: Self-pay | Admitting: Family Medicine

## 2011-03-22 ENCOUNTER — Other Ambulatory Visit: Payer: Self-pay | Admitting: *Deleted

## 2011-07-27 ENCOUNTER — Encounter: Payer: Self-pay | Admitting: Family Medicine

## 2011-08-27 ENCOUNTER — Other Ambulatory Visit: Payer: Self-pay | Admitting: Cardiovascular Disease

## 2011-09-04 ENCOUNTER — Other Ambulatory Visit: Payer: Self-pay | Admitting: Cardiovascular Disease

## 2011-09-06 ENCOUNTER — Ambulatory Visit: Payer: Medicaid Other | Admitting: Family Medicine

## 2011-09-12 ENCOUNTER — Ambulatory Visit (INDEPENDENT_AMBULATORY_CARE_PROVIDER_SITE_OTHER): Payer: Medicaid Other | Admitting: Family Medicine

## 2011-09-12 ENCOUNTER — Encounter: Payer: Self-pay | Admitting: Family Medicine

## 2011-09-12 VITALS — BP 124/81 | HR 80 | Temp 98.4°F | Ht 72.0 in | Wt 234.0 lb

## 2011-09-12 DIAGNOSIS — M545 Low back pain: Secondary | ICD-10-CM

## 2011-09-12 DIAGNOSIS — E785 Hyperlipidemia, unspecified: Secondary | ICD-10-CM

## 2011-09-12 DIAGNOSIS — I2510 Atherosclerotic heart disease of native coronary artery without angina pectoris: Secondary | ICD-10-CM

## 2011-09-12 DIAGNOSIS — F17200 Nicotine dependence, unspecified, uncomplicated: Secondary | ICD-10-CM

## 2011-09-12 DIAGNOSIS — I252 Old myocardial infarction: Secondary | ICD-10-CM

## 2011-09-12 LAB — COMPREHENSIVE METABOLIC PANEL
ALT: 23 U/L (ref 0–53)
AST: 18 U/L (ref 0–37)
CO2: 24 mEq/L (ref 19–32)
Calcium: 9.3 mg/dL (ref 8.4–10.5)
Chloride: 105 mEq/L (ref 96–112)
Potassium: 4.2 mEq/L (ref 3.5–5.3)
Sodium: 139 mEq/L (ref 135–145)
Total Protein: 6.4 g/dL (ref 6.0–8.3)

## 2011-09-12 LAB — LDL CHOLESTEROL, DIRECT: Direct LDL: 115 mg/dL — ABNORMAL HIGH

## 2011-09-12 MED ORDER — MELOXICAM 15 MG PO TABS: 15.0000 mg | ORAL_TABLET | Freq: Every day | ORAL | Status: DC

## 2011-09-12 MED ORDER — GABAPENTIN 300 MG PO CAPS: 300.0000 mg | ORAL_CAPSULE | Freq: Three times a day (TID) | ORAL | Status: DC

## 2011-09-12 MED ORDER — PREDNISONE 20 MG PO TABS: 20.0000 mg | ORAL_TABLET | Freq: Every day | ORAL | Status: AC

## 2011-09-12 NOTE — Patient Instructions (Addendum)
[name],  Thank you for coming to see me today.  Please start take the following for your back pain 1. Prednisone 20 mg daily x 10 days 2. Mobic 1 tab daily 3. Gabapentin start with one a day x 3 days, then twice daily x 3 days, then three times daily.     Smoking cessation support: smoking cessation hotline: 1-800-QUIT-NOW.  Here is the number to the smoking cessation classes at [facility]: 098-1191  F/u with me in one month.  Dr. Armen Pickup   Back exercise: Back Exercises These exercises may help you when beginning to rehabilitate your injury. Your symptoms may resolve with or without further involvement from your physician, physical therapist or athletic trainer. While completing these exercises, remember:    Restoring tissue flexibility helps normal motion to return to the joints. This allows healthier, less painful movement and activity.   An effective stretch should be held for at least 30 seconds.   A stretch should never be painful. You should only feel a gentle lengthening or release in the stretched tissue.  STRETCH - Extension, Prone on Elbows   Lie on your stomach on the floor, a bed will be too soft. Place your palms about shoulder width apart and at the height of your head.   Place your elbows under your shoulders. If this is too painful, stack pillows under your chest.   Allow your body to relax so that your hips drop lower and make contact more completely with the floor.   Hold this position for __________ seconds.   Slowly return to lying flat on the floor.  Repeat __________ times. Complete this exercise __________ times per day.   RANGE OF MOTION - Extension, Prone Press Ups   Lie on your stomach on the floor, a bed will be too soft. Place your palms about shoulder width apart and at the height of your head.   Keeping your back as relaxed as possible, slowly straighten your elbows while keeping your hips on the floor. You may adjust the placement of your  hands to maximize your comfort. As you gain motion, your hands will come more underneath your shoulders.   Hold this position __________ seconds.   Slowly return to lying flat on the floor.  Repeat __________ times. Complete this exercise __________ times per day.   RANGE OF MOTION- Quadruped, Neutral Spine   Assume a hands and knees position on a firm surface. Keep your hands under your shoulders and your knees under your hips. You may place padding under your knees for comfort.   Drop your head and point your tail bone toward the ground below you. This will round out your low back like an angry cat. Hold this position for __________ seconds.   Slowly lift your head and release your tail bone so that your back sags into a large arch, like an old horse.   Hold this position for __________ seconds.   Repeat this until you feel limber in your low back.   Now, find your "sweet spot." This will be the most comfortable position somewhere between the two previous positions. This is your neutral spine. Once you have found this position, tense your stomach muscles to support your low back.   Hold this position for __________ seconds.  Repeat __________ times. Complete this exercise __________ times per day.   STRETCH - Flexion, Single Knee to Chest   Lie on a firm bed or floor with both legs extended in front  of you.   Keeping one leg in contact with the floor, bring your opposite knee to your chest. Hold your leg in place by either grabbing behind your thigh or at your knee.   Pull until you feel a gentle stretch in your low back. Hold __________ seconds.   Slowly release your grasp and repeat the exercise with the opposite side.  Repeat __________ times. Complete this exercise __________ times per day.   STRETCH - Hamstrings, Standing  Stand or sit and extend your right / left leg, placing your foot on a chair or foot stool   Keeping a slight arch in your low back and your hips straight  forward.   Lead with your chest and lean forward at the waist until you feel a gentle stretch in the back of your right / left knee or thigh. (When done correctly, this exercise requires leaning only a small distance.)   Hold this position for __________ seconds.  Repeat __________ times. Complete this stretch __________ times per day. STRENGTHENING - Deep Abdominals, Pelvic Tilt   Lie on a firm bed or floor. Keeping your legs in front of you, bend your knees so they are both pointed toward the ceiling and your feet are flat on the floor.   Tense your lower abdominal muscles to press your low back into the floor. This motion will rotate your pelvis so that your tail bone is scooping upwards rather than pointing at your feet or into the floor.   With a gentle tension and even breathing, hold this position for __________ seconds.  Repeat __________ times. Complete this exercise __________ times per day.   STRENGTHENING - Abdominals, Crunches   Lie on a firm bed or floor. Keeping your legs in front of you, bend your knees so they are both pointed toward the ceiling and your feet are flat on the floor. Cross your arms over your chest.   Slightly tip your chin down without bending your neck.   Tense your abdominals and slowly lift your trunk high enough to just clear your shoulder blades. Lifting higher can put excessive stress on the low back and does not further strengthen your abdominal muscles.   Control your return to the starting position.  Repeat __________ times. Complete this exercise __________ times per day.   STRENGTHENING - Quadruped, Opposite UE/LE Lift   Assume a hands and knees position on a firm surface. Keep your hands under your shoulders and your knees under your hips. You may place padding under your knees for comfort.   Find your neutral spine and gently tense your abdominal muscles so that you can maintain this position. Your shoulders and hips should form a rectangle  that is parallel with the floor and is not twisted.   Keeping your trunk steady, lift your right hand no higher than your shoulder and then your left leg no higher than your hip. Make sure you are not holding your breath. Hold this position __________ seconds.   Continuing to keep your abdominal muscles tense and your back steady, slowly return to your starting position. Repeat with the opposite arm and leg.  Repeat __________ times. Complete this exercise __________ times per day. Document Released: 07/13/2005 Document Revised: 06/14/2011 Document Reviewed: 10/07/2008 [facility] Patient Information 2012 [place], [place].

## 2011-09-13 ENCOUNTER — Encounter: Payer: Self-pay | Admitting: Family Medicine

## 2011-09-13 ENCOUNTER — Telehealth: Payer: Self-pay | Admitting: Cardiology

## 2011-09-13 NOTE — Telephone Encounter (Signed)
Talked with pt. On 09/12/11 pt's  PCP gave mobic daily, gabapentin to eventually increase to TID , and prednisone 20mg  daily for 10 days for a back injury . Pt is asking if OK to take these meds with current heart meds. I will forward to Dr Shirlee Latch for review and recommendations.

## 2011-09-13 NOTE — Telephone Encounter (Signed)
Would limit Mobic if possible.  Others ok.

## 2011-09-13 NOTE — Telephone Encounter (Signed)
Please return call to patient at hm# [phone]  Patient has questions about RX interaction, as he received new prescriptions from his back doctor yesterday.  Please return call to patient at hm# to advise

## 2011-09-14 NOTE — Telephone Encounter (Signed)
LMOVM  Debbie Lenor Provencher RN  

## 2011-09-17 NOTE — Telephone Encounter (Signed)
Discussed with pt

## 2011-09-17 NOTE — Telephone Encounter (Signed)
LMTCB

## 2011-09-17 NOTE — Telephone Encounter (Signed)
F/U  Patient returning nurse AL call, he can be reached at [phone].   Patient says it is ok to lm on the vm for this number.

## 2011-09-21 ENCOUNTER — Encounter: Payer: Self-pay | Admitting: Family Medicine

## 2011-09-21 NOTE — Assessment & Plan Note (Addendum)
A: lumbar back pain. Chronic. Stable. P:  -prednisone burst -mobic -gabapentin  -back exercise  -close f/u in one month to assess % change (better, worse or no change).

## 2011-09-21 NOTE — Assessment & Plan Note (Signed)
History of MI s/p graft. Compliant with medications (effient and BB). Will check CMET and direct LDL.

## 2011-09-21 NOTE — Assessment & Plan Note (Signed)
Compliant with and tolerating zocor. Will check direct LDL.

## 2011-09-21 NOTE — Progress Notes (Signed)
Subjective:     Patient ID: George [name], male   DOB: 22-Jun-1966, 46 y.o.   MRN: [id number]  HPI 46 yo M new patient presents to establish primary care and to discuss the following:   1. Low back pain: since December of 2011. Patient reports that pain started when he was in the hospital for an MI. He has had a CT. He admits to pain daily. The pain interferes with his ability to work. The pain in moderate to severe. He has no pain free days.  He takes 1200-1400 mg of motrin daily for the pain. He reports pain in his low back, radiate down his bilateral hips and legs. He admits to numbness in his legs. He denies weight loss, fever, chills, urinary or fecal incontinence. He has not seen any specialist for the pain. He needs to get back to work but feels like he cannot work in his current state. He is interested in surgery if indicated.   2. Smoking: He smokes daily. Was smoking 2.5 PPD x 28 hrs. Now smoking 1.5 PPD. He has tried quitting in the past. He has tried the nicotine patch w/o success. He has tried no other nicotine supplements or pharmacotherapy.   Review of Systems Pertinent ROS as per HPI He denies CP, SOB, chronic cough. N/V/D, GI bleed. Dysuria.     Objective:   Physical Exam BP 124/81  Pulse 80  Temp(Src) 98.4 F (36.9 C) (Oral)  Ht 6' (1.829 m)  Wt 234 lb (106.142 kg)  BMI 31.74 kg/m2 General appearance: alert, cooperative, no distress and mildly obese Neck: no adenopathy, no carotid bruit, no JVD, supple, symmetrical, trachea midline and thyroid not enlarged, symmetric, no tenderness/mass/nodules Lungs: clear to auscultation bilaterally Heart: regular rate and rhythm, S1, S2 normal, no murmur, click, rub or gallop Back Exam: Inspection: normal, no lordosis or scoliosis Motion: full ROM SLR seated:                          SLR lying: XSLR seated:                        XSLR lying: Seated HS Flexibility: limited  Palpable tenderness: L3 down bilateral paraspinal tenderness   FABER: negative  Sensory change: negative  Reflex change: 2+   Strength at foot Plantar-flexion:  5/ 5    Dorsi-flexion: 5 / 5    Eversion: 5/ 5   Inversion:  5/ 5 Leg strength Quad:  55   Hamstring:  5/ 5   Hip flexor: 5 / 5   Hip abductors:  4/ 5 Gait Walking: normal      Assessment:         Plan:

## 2011-09-21 NOTE — Assessment & Plan Note (Signed)
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 Encouraged patient to think about what would have to change for him to quit. Gave resource to quit hotline.

## 2011-10-12 ENCOUNTER — Encounter: Payer: Self-pay | Admitting: Family Medicine

## 2011-10-12 ENCOUNTER — Ambulatory Visit (INDEPENDENT_AMBULATORY_CARE_PROVIDER_SITE_OTHER): Payer: Medicaid Other | Admitting: Family Medicine

## 2011-10-12 ENCOUNTER — Telehealth: Payer: Self-pay | Admitting: Family Medicine

## 2011-10-12 VITALS — BP 133/82 | HR 65 | Temp 97.9°F | Ht 72.0 in | Wt 237.0 lb

## 2011-10-12 DIAGNOSIS — M545 Low back pain, unspecified: Secondary | ICD-10-CM

## 2011-10-12 DIAGNOSIS — E785 Hyperlipidemia, unspecified: Secondary | ICD-10-CM

## 2011-10-12 MED ORDER — GABAPENTIN 300 MG PO CAPS: 600.0000 mg | ORAL_CAPSULE | Freq: Three times a day (TID) | ORAL | Status: DC

## 2011-10-12 NOTE — Telephone Encounter (Signed)
Completed Fenna Semel, Maryjo Rochester

## 2011-10-12 NOTE — Patient Instructions (Signed)
George [name],  Thank you for coming in to see me today.  Per cards recommendations, we will stop the mobic.  Please increase gabapentin to goal 600 mg three times daily.   Go for the MRI. Your stent will need to be cleared if you do not have your card.  I will call with the results.   Dr. Armen Pickup

## 2011-10-12 NOTE — Telephone Encounter (Signed)
Pt has an appt for MRI next wed at [facility] but it would be more convenient for him to go to [facility] in [place].  pls let him know.

## 2011-10-17 ENCOUNTER — Other Ambulatory Visit (HOSPITAL_COMMUNITY): Payer: Medicaid Other

## 2011-10-17 ENCOUNTER — Other Ambulatory Visit: Payer: Self-pay | Admitting: Nurse Practitioner

## 2011-10-17 ENCOUNTER — Other Ambulatory Visit: Payer: Self-pay | Admitting: Family Medicine

## 2011-10-17 DIAGNOSIS — Z139 Encounter for screening, unspecified: Secondary | ICD-10-CM

## 2011-10-18 NOTE — Telephone Encounter (Signed)
Refill on simvastatin

## 2011-10-19 NOTE — Progress Notes (Signed)
Patient ID: George [name], male   DOB: 1966-04-19, 46 y.o.   MRN: [id number] Subjective:    HPI 46 yo M presents to f/u chronic low back pain.   1. Low back pain: patient has completed course of steroids. He has started gabapentin. He is up to 1 tab 2-3x per day depending on what he has to do. He is concerned about it causing fatigue. He did not take mobic per his cardiologist recommendations. He reports that pain is better at rest. It is 8/10 with activity. He feels sharp low back pains "like a knife in his back". He reports an episode of excruciating pain following mis stepping off of a curb. Denies falls, urinary or fecal incontinence, fever and weight loss.   2. Hyperlipidemia: he received results letter. He is compliant with Zocor. He does not exercise. He denies upper or lower extremity myalgias.   Review of Systems Pertinent ROS as per HPI     Objective:   Physical Exam BP 133/82  Pulse 65  Temp(Src) 97.9 F (36.6 C) (Oral)  Ht 6' (1.829 m)  Wt 237 lb (107.502 kg)  BMI 32.14 kg/m2 General appearance: alert, cooperative, no distress and mildly obese Lungs: clear to auscultation bilaterally Heart: regular rate and rhythm, S1, S2 normal, no murmur, click, rub or gallop Back Exam: Inspection: normal, no lordosis or scoliosis Motion: full ROM SLR seated: negative              SLR lying: negative  XSLR seated: negative            XSLR lying: negative  Seated HS Flexibility: limited  Palpable tenderness: L3 down bilateral paraspinal tenderness  FABER: negative  Sensory change: negative  Reflex change: 2+   Strength at foot Plantar-flexion:  5/ 5    Dorsi-flexion: 5 / 5    Eversion: 5/ 5   Inversion:  5/ 5 Leg strength Quad:  55   Hamstring:  5/ 5   Hip flexor: 5 / 5   Hip abductors:  4/ 5 Gait Walking: normal      Assessment and Plan:  See Problem List

## 2011-10-19 NOTE — Assessment & Plan Note (Addendum)
A: lumbar back pain. Chronic. Slight improvement following steroid course.  P:  -D/C mobic -increase gabapentin to 300 mg TID -continue back exercises as tolerated.  -MRI of L spine with planned referral to neurosurgery if there is evidence of interval worsening in known degeneration and arthropathy. Patient would like a referral to his dad's surgeon [facility]  -Check vitamin D level at f/u. Consider bisphosphonate. -Consider lidoderm patch for additional pain control.

## 2011-10-19 NOTE — Assessment & Plan Note (Signed)
LDL 115 up from previous value of 93. LDl goal < 70. Recommended continue Zocor. Low fat diet. Recheck fasting lipid panel in 6 months. With plan to add fibrate like gemfibrozil 600 mg BID.

## 2011-10-20 ENCOUNTER — Ambulatory Visit
Admission: RE | Admit: 2011-10-20 | Discharge: 2011-10-20 | Disposition: A | Discharge status: Home / Self Care | Payer: Medicaid Other | Source: Ambulatory Visit | Attending: Family Medicine | Admitting: Family Medicine

## 2011-10-20 ENCOUNTER — Ambulatory Visit
Admission: RE | Admit: 2011-10-20 | Discharge: 2011-10-20 | Disposition: A | Discharge status: Home / Self Care | Payer: Medicaid Other | Source: Ambulatory Visit | Attending: *Deleted | Admitting: *Deleted

## 2011-10-20 DIAGNOSIS — Z139 Encounter for screening, unspecified: Secondary | ICD-10-CM

## 2011-10-20 DIAGNOSIS — M545 Low back pain: Secondary | ICD-10-CM

## 2011-10-23 ENCOUNTER — Telehealth: Payer: Self-pay | Admitting: Family Medicine

## 2011-10-23 MED ORDER — SIMVASTATIN 40 MG PO TABS: 40.0000 mg | ORAL_TABLET | Freq: Every day | ORAL | Status: DC

## 2011-10-23 NOTE — Telephone Encounter (Signed)
Called back. 1. Refilled simvastatin 2. Based on MRI results I do not believes surgery is indicated (lack of neural impingement). Plan to call neurology with hopes of getting a phone consult. If neurosurgery does not think surgery will be beneficial will go forward to pharmacotherapy and PT.

## 2011-10-23 NOTE — Telephone Encounter (Signed)
Called patient left VM. Called to discuss MRI of low back findings and the next step in management. Asked patient to call back at his earliest convenience.

## 2011-10-23 NOTE — Telephone Encounter (Signed)
Pt is asking for results of his MRI and also about referral to Dr Danielle Dess - neurology  Also wanted to know if Dr Armen Pickup can refill his simvastatin or does he need to get this from his cardiologist.  He will be out this Friday [facility]

## 2011-10-23 NOTE — Telephone Encounter (Signed)
Message copied by Dessa Phi on Tue Oct 23, 2011  2:04 PM ------      Message from: Zachery Dauer      Created: Mon Oct 22, 2011  5:49 PM                   ----- Message -----         From: Rad Results In Interface         Sent: 10/22/2011   4:17 PM           To: Tobin Chad, MD

## 2011-10-26 ENCOUNTER — Encounter: Payer: Self-pay | Admitting: Family Medicine

## 2011-10-26 ENCOUNTER — Telehealth: Payer: Self-pay | Admitting: Family Medicine

## 2011-10-26 DIAGNOSIS — M545 Low back pain: Secondary | ICD-10-CM

## 2011-10-26 NOTE — Assessment & Plan Note (Signed)
Precept ed with Dr. Denny Levy based on imaging and PE findings the patient is not a surgical candidate. Plan to send to PT and increase Gabapentin to goal of 2400 mg daily

## 2011-10-26 NOTE — Telephone Encounter (Signed)
[name] calling back to ask about conversation with Dr. Danielle Dess to determine surgery or PT.  Would like to discuss with you early part of next week

## 2011-10-26 NOTE — Telephone Encounter (Signed)
Called patient at home left VM.  1. Surgery not indicated based on imaging and PE findings.  2. Will send to PT order placed.  3. Plan to increase gabapentin to 2400 mg q D, no changes until follow-up via visit or call to assess tolerance.

## 2011-11-14 NOTE — Telephone Encounter (Signed)
Pt never got message - please call his cell # [phone] I read to him the message you had left, but he has other questions and needs to speak with Dr Armen Pickup asap.  He is not tolerating the gabapentin well and needs to know what to do.  It's messing with his head.

## 2011-11-28 ENCOUNTER — Telehealth: Payer: Self-pay | Admitting: Family Medicine

## 2011-11-28 DIAGNOSIS — M545 Low back pain: Secondary | ICD-10-CM

## 2011-11-28 MED ORDER — DULOXETINE HCL 30 MG PO CPEP: 30.0000 mg | ORAL_CAPSULE | Freq: Every day | ORAL | Status: DC

## 2011-11-28 NOTE — Telephone Encounter (Signed)
Called patient. He is having persistent pain in his low back with pinching. He stopped Gabapentin a week after starting due to fatigue and feeling altered (foggy).  Plan: -cymbalta 30 mg daily x 1 week, increase to 60 mg daily as tolerated.  -referral to PT -referral to neurosurgery: Dr. Christena Flake.

## 2011-11-28 NOTE — Telephone Encounter (Signed)
Patient is quite frustrated with medical problem regarding his back pain.  Need to speak with you to discuss another pain regimen as well as referral to neuro.  Please contact him today at the number given at 1st msg and listed with this note.  Have not been able to perform his duties at work without there being some extreme discomfort.

## 2011-11-28 NOTE — Assessment & Plan Note (Signed)
A: persistent and uncontrolled. P: -PT referral -neurosurgery referral -cymbalta 30 mg daily x 1 week, then 60 mg daily as tolerated.

## 2011-11-30 ENCOUNTER — Telehealth: Payer: Self-pay | Admitting: Cardiology

## 2011-11-30 NOTE — Telephone Encounter (Signed)
Pt went to see a Neuro provider for his back and was given a Rx but is unable to take it because of his heart and he needs to talk to someone about this and find out what he can do

## 2011-11-30 NOTE — Telephone Encounter (Signed)
Patient called was told Dr.Mclean advised to take gabapentin.He preferred that to cymbalta.

## 2011-11-30 NOTE — Telephone Encounter (Signed)
Patient called wanting to check with Dr.McLean to see which medication he prefers him to take cymbalta or gabapentin.Fowarded to [place] for advice.

## 2011-11-30 NOTE — Telephone Encounter (Signed)
Gabapentin should be ok for pain.  Would prefer that to Cymbalta.

## 2011-12-05 ENCOUNTER — Ambulatory Visit: Payer: Medicaid Other | Attending: Neurosurgery | Admitting: Physical Therapy

## 2011-12-05 DIAGNOSIS — M545 Low back pain, unspecified: Secondary | ICD-10-CM | POA: Insufficient documentation

## 2011-12-05 DIAGNOSIS — IMO0001 Reserved for inherently not codable concepts without codable children: Secondary | ICD-10-CM | POA: Insufficient documentation

## 2011-12-05 DIAGNOSIS — R5381 Other malaise: Secondary | ICD-10-CM | POA: Insufficient documentation

## 2011-12-07 ENCOUNTER — Ambulatory Visit: Payer: Medicaid Other | Admitting: Physical Therapy

## 2011-12-14 ENCOUNTER — Encounter: Payer: Medicaid Other | Admitting: Physical Therapy

## 2011-12-19 ENCOUNTER — Ambulatory Visit: Payer: Medicaid Other | Admitting: Physical Therapy

## 2011-12-21 ENCOUNTER — Ambulatory Visit: Payer: Medicaid Other | Attending: Neurosurgery | Admitting: *Deleted

## 2011-12-21 DIAGNOSIS — M545 Low back pain, unspecified: Secondary | ICD-10-CM | POA: Insufficient documentation

## 2011-12-21 DIAGNOSIS — R5381 Other malaise: Secondary | ICD-10-CM | POA: Insufficient documentation

## 2011-12-21 DIAGNOSIS — IMO0001 Reserved for inherently not codable concepts without codable children: Secondary | ICD-10-CM | POA: Insufficient documentation

## 2012-01-02 ENCOUNTER — Telehealth: Payer: Self-pay | Admitting: Cardiology

## 2012-01-02 MED ORDER — PRASUGREL HCL 10 MG PO TABS: 10.0000 mg | ORAL_TABLET | Freq: Every day | ORAL | Status: DC

## 2012-01-02 NOTE — Telephone Encounter (Signed)
Spoke with pt. Appt made with Dr Shirlee Latch for 02/07/12.

## 2012-01-02 NOTE — Telephone Encounter (Signed)
Follow up on previous call:  Patient return call back to nurse Thurston Hole

## 2012-01-02 NOTE — Telephone Encounter (Signed)
I did receive authorization from [facility] [phone]  for Effient 10 mg daily  for 1 year. The last OV I can find for pt is 08/10/10 with Tereso Newcomer. OK to fill medication x1. Pt needs to make an appt with Dr Shirlee Latch before can fill anymore. LMTCB for pt.

## 2012-01-02 NOTE — Telephone Encounter (Signed)
LMTCB

## 2012-01-23 ENCOUNTER — Encounter: Payer: Self-pay | Admitting: *Deleted

## 2012-02-02 ENCOUNTER — Emergency Department (HOSPITAL_COMMUNITY): Payer: Medicaid Other

## 2012-02-02 ENCOUNTER — Emergency Department (HOSPITAL_COMMUNITY)
Admission: EM | Admit: 2012-02-02 | Discharge: 2012-02-02 | Disposition: A | Discharge status: Home / Self Care | Payer: Medicaid Other | Attending: Emergency Medicine | Admitting: Emergency Medicine

## 2012-02-02 ENCOUNTER — Encounter (HOSPITAL_COMMUNITY): Payer: Self-pay | Admitting: *Deleted

## 2012-02-02 DIAGNOSIS — I252 Old myocardial infarction: Secondary | ICD-10-CM | POA: Insufficient documentation

## 2012-02-02 DIAGNOSIS — I2510 Atherosclerotic heart disease of native coronary artery without angina pectoris: Secondary | ICD-10-CM | POA: Insufficient documentation

## 2012-02-02 DIAGNOSIS — S62501B Fracture of unspecified phalanx of right thumb, initial encounter for open fracture: Secondary | ICD-10-CM

## 2012-02-02 DIAGNOSIS — M1990 Unspecified osteoarthritis, unspecified site: Secondary | ICD-10-CM | POA: Insufficient documentation

## 2012-02-02 DIAGNOSIS — F17200 Nicotine dependence, unspecified, uncomplicated: Secondary | ICD-10-CM | POA: Insufficient documentation

## 2012-02-02 DIAGNOSIS — Z9861 Coronary angioplasty status: Secondary | ICD-10-CM | POA: Insufficient documentation

## 2012-02-02 DIAGNOSIS — S61209A Unspecified open wound of unspecified finger without damage to nail, initial encounter: Secondary | ICD-10-CM | POA: Insufficient documentation

## 2012-02-02 DIAGNOSIS — W298XXA Contact with other powered powered hand tools and household machinery, initial encounter: Secondary | ICD-10-CM | POA: Insufficient documentation

## 2012-02-02 MED ORDER — HYDROCODONE-ACETAMINOPHEN 5-325 MG PO TABS
1.0000 | ORAL_TABLET | Freq: Once | ORAL | Status: AC
  Administered 2012-02-02: 1 via ORAL
  Filled 2012-02-02: qty 1

## 2012-02-02 MED ORDER — CEPHALEXIN 500 MG PO CAPS: 500.0000 mg | ORAL_CAPSULE | Freq: Four times a day (QID) | ORAL | Status: AC

## 2012-02-02 MED ORDER — HYDROCODONE-ACETAMINOPHEN 5-325 MG PO TABS: 1.0000 | ORAL_TABLET | Freq: Four times a day (QID) | ORAL | Status: DC | PRN

## 2012-02-02 NOTE — ED Notes (Signed)
Pt cut anterior aspect of left thumb with table saw, area has stopped bleeding. Well approximated laceration noted from tip of finger to DIP.
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
## 2012-02-02 NOTE — Progress Notes (Signed)
Orthopedic Tech Progress Note Patient Details:  George [name] May 23, 1966 [id number]  Ortho Devices Ortho Device/Splint Location: finger splint Ortho Device/Splint Interventions: Application   Cammer, Mickie Bail 02/02/2012, 3:42 PM

## 2012-02-04 ENCOUNTER — Encounter: Payer: Self-pay | Admitting: *Deleted

## 2012-02-06 ENCOUNTER — Encounter (HOSPITAL_COMMUNITY): Payer: Self-pay | Admitting: *Deleted

## 2012-02-06 ENCOUNTER — Encounter: Payer: Self-pay | Admitting: Family Medicine

## 2012-02-06 DIAGNOSIS — F17200 Nicotine dependence, unspecified, uncomplicated: Secondary | ICD-10-CM | POA: Insufficient documentation

## 2012-02-06 DIAGNOSIS — G479 Sleep disorder, unspecified: Secondary | ICD-10-CM | POA: Insufficient documentation

## 2012-02-06 DIAGNOSIS — I2510 Atherosclerotic heart disease of native coronary artery without angina pectoris: Secondary | ICD-10-CM | POA: Insufficient documentation

## 2012-02-06 DIAGNOSIS — M79609 Pain in unspecified limb: Secondary | ICD-10-CM | POA: Insufficient documentation

## 2012-02-06 DIAGNOSIS — K589 Irritable bowel syndrome without diarrhea: Secondary | ICD-10-CM | POA: Insufficient documentation

## 2012-02-06 DIAGNOSIS — M1990 Unspecified osteoarthritis, unspecified site: Secondary | ICD-10-CM | POA: Insufficient documentation

## 2012-02-06 NOTE — ED Notes (Addendum)
Pt had injury to R thumb on 7/27.  He had 8 sutures and has followed up with hand specialist (Dr Cindee Salt) on Monday.  Since then his R thumb has been swelling.  Today the pain became intense and began spreading to R index and middle finger.  Pt also c/o expelling a large amount of puss from R thumb.

## 2012-02-07 ENCOUNTER — Emergency Department (HOSPITAL_COMMUNITY)
Admit: 2012-02-07 | Discharge: 2012-02-07 | Disposition: A | Discharge status: Home / Self Care | Payer: Medicaid Other | Attending: Emergency Medicine | Admitting: Emergency Medicine

## 2012-02-07 ENCOUNTER — Emergency Department (HOSPITAL_COMMUNITY)
Admission: EM | Admit: 2012-02-07 | Discharge: 2012-02-07 | Disposition: A | Discharge status: Home / Self Care | Payer: Medicaid Other | Attending: Emergency Medicine | Admitting: Emergency Medicine

## 2012-02-07 ENCOUNTER — Ambulatory Visit (INDEPENDENT_AMBULATORY_CARE_PROVIDER_SITE_OTHER): Payer: Medicaid Other | Admitting: Cardiology

## 2012-02-07 ENCOUNTER — Encounter: Payer: Self-pay | Admitting: Cardiology

## 2012-02-07 VITALS — BP 116/68 | HR 60 | Ht 72.0 in | Wt 232.0 lb

## 2012-02-07 DIAGNOSIS — M79646 Pain in unspecified finger(s): Secondary | ICD-10-CM

## 2012-02-07 DIAGNOSIS — F17200 Nicotine dependence, unspecified, uncomplicated: Secondary | ICD-10-CM

## 2012-02-07 DIAGNOSIS — M545 Low back pain: Secondary | ICD-10-CM

## 2012-02-07 DIAGNOSIS — E785 Hyperlipidemia, unspecified: Secondary | ICD-10-CM

## 2012-02-07 DIAGNOSIS — I2510 Atherosclerotic heart disease of native coronary artery without angina pectoris: Secondary | ICD-10-CM

## 2012-02-07 MED ORDER — ASPIRIN 81 MG PO TABS: 81.0000 mg | ORAL_TABLET | Freq: Every day | ORAL | Status: DC

## 2012-02-07 NOTE — Patient Instructions (Addendum)
Your physician wants you to follow-up in: 6 months. You will receive a reminder letter in the mail two months in advance. If you don't receive a letter, please call our office to schedule the follow-up appointment.  Your physician recommends that you return for fasting lab work tomorrow or next week. The lab opens at 8:30 daily  Your physician has recommended you make the following change in your medication:  Stop Effient. Decrease aspirin to 81 mg by mouth daily.

## 2012-02-07 NOTE — ED Notes (Addendum)
Laceration well approximated with stiches, but area red swollen. Pain from thumb to elbow
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
## 2012-02-07 NOTE — ED Notes (Signed)
Pt states understanding of discharge instructions 

## 2012-02-07 NOTE — ED Notes (Signed)
Pt to Xray.

## 2012-02-07 NOTE — Progress Notes (Signed)
Patient ID: George [name], male   DOB: Nov 07, 1965, 46 y.o.   MRN: [id number] PCP: Dr. Efrain Sella Flemmer is a 46 yo male smoker with a h/o hyperlipidemia and sleep apnea and a negative myoview in 2010 who presented to [facility] 07/07/10 with chest pain. He ruled in for NSTEMI. Cath demonstrated a 99% CFX lesion that was treated with a DES. Cath also demonstrated pLAD 60% stenosis. He has multilevel DDD and DJD with chronic severe back pain.  He is trying to get disability.   I have not seen him for a couple of years.  He has been stable symptomatically.  Main complaint continues to be severe back pain and numbness in his legs.  He has been told that there is no back surgery option.  PT did not help.  He denies exertional dyspnea but is not very active because of the back pain.  No chest pain or NTG use.  He has occasional heart "fluttering" that can last up to 5 minutes.  It is irregular but not fast.  He gets it fairly rarely.  No lightheadedness or syncope. He continues to smoke 2 ppd and has had a hard time quitting due to "stress."  Finally, on Saturday he cut is right thumb with a table saw and fractured the bone.  There is an open wound.  He has been to the hand surgeon twice since then.  He is concerned that it is infected (no fever).   ECG: NSR, normal  Labs (3/13): LDL 115, TGs 170, HDL 32, K 4.2, creatinine 1.1  Allergies (verified):  1) ! * Anaprox  2) ! * Tetanus   Past Medical History:  1. CAD: s/p NSTEMI 06/2010 tx with DES to CFX.  LHC with pLAD 60%, pCFX 30%, m99% (PCI) then 60%, PLV 60% (small); EF 55%.  Echo 06/2010: EF 60-65%; mod LVH.  2. CIGARETTE SMOKER (ICD-305. 3. GERD (ICD-530.81)  4. IRRITABLE BOWEL SYNDROME (ICD-564.1)  5. DEGENERATIVE JOINT DISEASE (ICD-715.90)  6. BACK PAIN, LUMBAR (ICD-724.2): severe pain 7. ANXIETY (ICD-300.00)   Review of Systems  As per the HPI. All other systems reviewed and negative.   FH: Father with CAD  SH: Smoker (2 ppd), unemployed.    Current Outpatient Prescriptions  Medication Sig Dispense Refill  . aspirin 81 MG tablet Take 1 tablet (81 mg total) by mouth daily.  30 tablet  0  . cephALEXin (KEFLEX) 500 MG capsule Take 1 capsule (500 mg total) by mouth 4 (four) times daily.  28 capsule  0  . HYDROcodone-acetaminophen (NORCO/VICODIN) 5-325 MG per tablet Take 1 tablet by mouth every 6 (six) hours as needed for pain.  15 tablet  0  . metoprolol tartrate (LOPRESSOR) 25 MG tablet Take 12.5 mg by mouth 2 (two) times daily.      . simvastatin (ZOCOR) 40 MG tablet Take 40 mg by mouth at bedtime.      . sulfamethoxazole-trimethoprim (BACTRIM DS,SEPTRA DS) 800-160 MG per tablet Take 1 tablet by mouth 2 (two) times daily.        BP 116/68  Pulse 60  Ht 6' (1.829 m)  Wt 232 lb (105.235 kg)  BMI 31.47 kg/m2 General: NAD Neck: No JVD, no thyromegaly or thyroid nodule.  Lungs: Clear to auscultation bilaterally with normal respiratory effort. CV: Nondisplaced PMI.  Heart regular S1/S2, no S3/S4, no murmur.  No peripheral edema.  No carotid bruit.  Normal pedal pulses.  Abdomen: Soft, nontender, no hepatosplenomegaly, no distention.  Skin:  Intact without lesions or rashes.  Neurologic: Alert and oriented x 3.  Psych: Normal affect. Extremities: No clubbing or cyanosis.  MSK: right thumb wrapped (s/p table saw injury).

## 2012-02-07 NOTE — ED Provider Notes (Signed)
Medical screening examination/treatment/procedure(s) were performed by non-physician practitioner and as supervising physician I was immediately available for consultation/collaboration.    Shalandria Elsbernd D Kyren Knick, MD 02/07/12 0430 

## 2012-02-08 ENCOUNTER — Telehealth: Payer: Self-pay | Admitting: Family Medicine

## 2012-02-08 NOTE — Assessment & Plan Note (Signed)
Needs to quit.  He is under a lot of stress and not sure that he can right now.  When he is ready, I told him that I would give him a Wellbutrin prescription.

## 2012-02-08 NOTE — Assessment & Plan Note (Signed)
No ischemic symptoms.  At this time, he can stop Effient.  Needs to decrease ASA to 81 mg daily.  Continue statin, metoprolol.  Eventually, would like to get him on ACEI for secondary prevention.

## 2012-02-08 NOTE — Telephone Encounter (Signed)
Attempted to return call.  No answer so left VMM for him to call us back.

## 2012-02-08 NOTE — Assessment & Plan Note (Signed)
His main concern at this time.  Has considerable pain.

## 2012-02-08 NOTE — Assessment & Plan Note (Signed)
I will check lipids.  If LDL not < 70, will change statin to atorvastatin.

## 2012-02-08 NOTE — Telephone Encounter (Signed)
Pt is asking to speak with nurse about an infection in his finger.  Was at ED yesterday

## 2012-02-09 ENCOUNTER — Encounter (HOSPITAL_COMMUNITY): Payer: Self-pay | Admitting: Emergency Medicine

## 2012-02-09 ENCOUNTER — Emergency Department (HOSPITAL_COMMUNITY)
Admission: EM | Admit: 2012-02-09 | Discharge: 2012-02-09 | Disposition: A | Discharge status: Home / Self Care | Payer: Medicaid Other | Attending: Emergency Medicine | Admitting: Emergency Medicine

## 2012-02-09 DIAGNOSIS — F17200 Nicotine dependence, unspecified, uncomplicated: Secondary | ICD-10-CM | POA: Insufficient documentation

## 2012-02-09 DIAGNOSIS — G479 Sleep disorder, unspecified: Secondary | ICD-10-CM | POA: Insufficient documentation

## 2012-02-09 DIAGNOSIS — Z4801 Encounter for change or removal of surgical wound dressing: Secondary | ICD-10-CM | POA: Insufficient documentation

## 2012-02-09 DIAGNOSIS — I2510 Atherosclerotic heart disease of native coronary artery without angina pectoris: Secondary | ICD-10-CM | POA: Insufficient documentation

## 2012-02-09 DIAGNOSIS — M1990 Unspecified osteoarthritis, unspecified site: Secondary | ICD-10-CM | POA: Insufficient documentation

## 2012-02-09 DIAGNOSIS — K589 Irritable bowel syndrome without diarrhea: Secondary | ICD-10-CM | POA: Insufficient documentation

## 2012-02-09 DIAGNOSIS — I252 Old myocardial infarction: Secondary | ICD-10-CM | POA: Insufficient documentation

## 2012-02-09 DIAGNOSIS — Z5189 Encounter for other specified aftercare: Secondary | ICD-10-CM

## 2012-02-09 MED ORDER — OXYCODONE-ACETAMINOPHEN 5-325 MG PO TABS: 2.0000 | ORAL_TABLET | ORAL | Status: AC | PRN

## 2012-02-09 NOTE — ED Notes (Signed)
Pt reports infection in right thumb and having pain to right hand. Pt seen here for same on Wednesday and followed up with hand Dr on Thursday. Pt reports taking prescribed medications as directed. Thumb noted to have sutures that were placed last Saturday, area noted to have yellowish drainage.
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
## 2012-02-09 NOTE — ED Notes (Addendum)
Per verbal order from Dr. Fonnie Jarvis. Pt is to soak right thumb in a half sterile water half hydrogen peroxide mixture for 15-30 minutes then reassess site. Pt tolerating well. Pt began soaking thumb at 1610.

## 2012-02-11 ENCOUNTER — Other Ambulatory Visit: Payer: Medicaid Other

## 2012-02-11 NOTE — Telephone Encounter (Signed)
Finally able to reach patient this am.  He went back to the ED on Saturday d/t increasing thumb pain and drainage.  Wound was debrided and cleaned.  He was instructed to soak it 3 times a day followed by clean dressing change.  He feels that the wound is finally getting better.  Advised him to come in for a follow up visit with his PCP tomorrow just to make sure it is continuing to heal.  He has a follow up appointment with the hand specialist today but he does not want to go d/t dissatisfaction with his previous visit.  Advised him to cancel it and we would refer him to another hand specialist if Dr. Armen Pickup feels it is warranted.  Appointment made for 8:45 tomorrow am.

## 2012-02-12 ENCOUNTER — Encounter: Payer: Self-pay | Admitting: Family Medicine

## 2012-02-12 ENCOUNTER — Ambulatory Visit (INDEPENDENT_AMBULATORY_CARE_PROVIDER_SITE_OTHER): Payer: Medicaid Other | Admitting: Family Medicine

## 2012-02-12 VITALS — BP 130/78 | HR 63 | Temp 98.0°F | Ht 72.0 in | Wt 230.0 lb

## 2012-02-12 DIAGNOSIS — S61209A Unspecified open wound of unspecified finger without damage to nail, initial encounter: Secondary | ICD-10-CM

## 2012-02-12 DIAGNOSIS — S61011A Laceration without foreign body of right thumb without damage to nail, initial encounter: Secondary | ICD-10-CM | POA: Insufficient documentation

## 2012-02-12 MED ORDER — HYDROCODONE-ACETAMINOPHEN 5-325 MG PO TABS: 1.0000 | ORAL_TABLET | Freq: Four times a day (QID) | ORAL | Status: AC | PRN

## 2012-02-12 NOTE — Progress Notes (Signed)
Subjective:     Patient ID: George [name], male   DOB: 13-Apr-1966, 46 y.o.   MRN: [id number]  HPI 46 yo M presents for f/u of R thumb laceration. He cut his thumb on a table saw on 02/02/12 during a fall. He has since been evaluated and treated at the ED and by Dr. Merlyn Lot (orthopaedic and hand specialist). He reports slow improvement in his hand. He recently had 4 of the 8 sutures removed. He is frustrated by the care he received. He has been instructed to return to the hand specialist for suture removal. For now he is taking keflex x 7 days course and bactrim x 10 days course. He is washing his hand 3-4 x per day in sterile water or water with peroxide. He admits to some pain and swelling that resolves with soaking. He denies fever. He is not elevating his hand. He is taking nothing for the pain because he ran out of vicodin and dislikes the way percocet makes him feel.   Review of Systems As per HPI     Objective:   Physical Exam BP 130/78  Pulse 63  Temp 98 F (36.7 C) (Oral)  Ht 6' (1.829 m)  Wt 230 lb (104.327 kg)  BMI 31.19 kg/m2 General appearance: alert, cooperative and no distress Extremities: R thumb with longitudinal wound from distal to proximal ~2 cm, on the palmar surface. There are 4 sutures in place. There is slight serosanguineous drainage. Sensation is intact. radial pulse is intact. Full srenght in thumb. There is no eryhtematous streaking from digit to hand. There is swelling.   DG R thumb 02/02/12:  IMPRESSION:  Fracture at the tip of distal phalanx associated with soft tissue  injury.   DG R thumb 02/07/12 IMPRESSION:  No interval change in the appearance of the fracture the tip of the  distal phalanx first digit.  Similar to prior, multiple small punctate areas of high attenuation  within the overlying soft tissue, which may reflect small osseous  fragments or foreign bodies.     Assessment and Plan:

## 2012-02-12 NOTE — Patient Instructions (Addendum)
George [name],  Thank you for coming in today. I am glad that your thumb is finally healing.   1. Finish keflex: 7 days 2. Finish bactrim: 10 days 3. Soak: sterile water mostly 4. Keep arm elevated as much as possible.  5. Take vicodin for severe pain as needed. Do not mix with percocet.   Please f/u with Dr. Merlyn Lot for suture removal. Call and come in if pain, redness or swelling is worsening.  Dr. Armen Pickup

## 2012-02-12 NOTE — Assessment & Plan Note (Addendum)
A: laceration of R thumb with distal phalanx fracture. Improving on keflex and bactrim. P: 1. Finish keflex: 7 days 2. Finish bactrim: 10 days 3. Soak: sterile water mostly 4. Keep arm elevated as much as possible.  5. Take vicodin for severe pain as needed. Do not mix with percocet.   Please f/u with Dr. Merlyn Lot for suture removal. Call and come in if pain, redness or swelling is worsening.

## 2012-02-20 ENCOUNTER — Encounter: Payer: Self-pay | Admitting: Family Medicine

## 2012-02-20 ENCOUNTER — Other Ambulatory Visit: Payer: Self-pay | Admitting: Family Medicine

## 2012-02-20 MED ORDER — DOXYCYCLINE HYCLATE 100 MG PO CAPS: 100.0000 mg | ORAL_CAPSULE | Freq: Two times a day (BID) | ORAL | Status: AC

## 2012-02-25 NOTE — Addendum Note (Signed)
Addended by: Micki Riley C on: 02/25/2012 03:48 PM   Modules accepted: Orders

## 2012-03-07 ENCOUNTER — Encounter: Payer: Self-pay | Admitting: Family Medicine

## 2012-04-02 ENCOUNTER — Other Ambulatory Visit: Payer: Self-pay | Admitting: Cardiovascular Disease

## 2012-05-02 ENCOUNTER — Ambulatory Visit (INDEPENDENT_AMBULATORY_CARE_PROVIDER_SITE_OTHER): Payer: Medicaid Other | Admitting: Family Medicine

## 2012-05-02 ENCOUNTER — Encounter: Payer: Self-pay | Admitting: Family Medicine

## 2012-05-02 VITALS — BP 132/77 | HR 71 | Temp 98.1°F | Ht 72.0 in | Wt 236.0 lb

## 2012-05-02 DIAGNOSIS — M654 Radial styloid tenosynovitis [de Quervain]: Secondary | ICD-10-CM

## 2012-05-02 NOTE — Patient Instructions (Addendum)
George [name],  Thank you for coming in to see me today.  The differentials for your pain include carpal tunnel vs. De Quervain's tenosynovitis.   For both, the treatments are similar  1. Splint. Velcro thumb spica splint.  2. Ice.   If splinting and ice do not help, local steroid injection.   F/u in 2 weeks if no improvement.   Dr. Armen Pickup   Carpal Tunnel Syndrome The carpal tunnel is a narrow area located on the palm side of your wrist. The tunnel is formed by the wrist bones and ligaments. Nerves, blood vessels, and tendons pass through the carpal tunnel. Repeated wrist motion or certain diseases may cause swelling within the tunnel. This swelling pinches the main nerve in the wrist (median nerve) and causes the painful hand and arm condition called carpal tunnel syndrome. CAUSES   Repeated wrist motions.  Wrist injuries.  Certain diseases like arthritis, diabetes, alcoholism, hyperthyroidism, and kidney failure.  Obesity.  Pregnancy. SYMPTOMS   A "pins and needles" feeling in your fingers or hand.  Tingling or numbness in your fingers or hand.  An aching feeling in your entire arm.  Wrist pain that goes up your arm to your shoulder.  Pain that goes down into your palm or fingers.  A weak feeling in your hands. DIAGNOSIS  Your caregiver will take your history and perform a physical exam. An electromyography test may be needed. This test measures electrical signals sent out by the muscles. The electrical signals are usually slowed by carpal tunnel syndrome. You may also need X-rays. TREATMENT  Carpal tunnel syndrome may clear up by itself. Your caregiver may recommend a wrist splint or medicine such as a nonsteroidal anti-inflammatory medicine. Cortisone injections may help. Sometimes, surgery may be needed to free the pinched nerve.  HOME CARE INSTRUCTIONS   Take all medicine as directed by your caregiver. Only take over-the-counter or prescription medicines for pain,  discomfort, or fever as directed by your caregiver.  If you were given a splint to keep your wrist from bending, wear it as directed. It is important to wear the splint at night. Wear the splint for as long as you have pain or numbness in your hand, arm, or wrist. This may take 1 to 2 months.  Rest your wrist from any activity that may be causing your pain. If your symptoms are work-related, you may need to talk to your employer about changing to a job that does not require using your wrist.  Put ice on your wrist after long periods of wrist activity.  Put ice in a plastic bag.  Place a towel between your skin and the bag.  Leave the ice on for 15 to 20 minutes, 3 to 4 times a day.  Keep all follow-up visits as directed by your caregiver. This includes any orthopedic referrals, physical therapy, and rehabilitation. Any delay in getting necessary care could result in a delay or failure of your condition to heal. SEEK IMMEDIATE MEDICAL CARE IF:   You have new, unexplained symptoms.  Your symptoms get worse and are not helped or controlled with medicines. MAKE SURE YOU:   Understand these instructions.  Will watch your condition.  Will get help right away if you are not doing well or get worse. Document Released: 06/22/2000 Document Revised: 09/17/2011 Document Reviewed: 05/11/2011 [facility] Patient Information 2013 [place], [place].  De Quervain's Tenosynovitis De Quervain's tenosynovitis involves inflammation of one or two tendon linings (sheaths) or strain of one or two  tendons to the thumb: extensor pollicis brevis (EPB), or abductor pollicis longus (APL). This causes pain on the side of the wrist and base of the thumb. Tendon sheaths secrete a fluid that lubricates the tendon, allowing the tendon to move smoothly. When the sheath becomes inflamed, the tendon cannot move freely in the sheath. Both the EPB and APL tendons are important for proper use of the hand. The EPB tendon is  important for straightening the thumb. The APL tendon is important for moving the thumb away from the index finger (abducting). The two tendons pass through a small tube (canal) in the wrist, near the base of the thumb. When the tendons become inflamed, pain is usually felt in this area. SYMPTOMS   Pain, tenderness, swelling, warmth, or redness over the base of the thumb and thumb side of the wrist.  Pain that gets worse when straightening the thumb.  Pain that gets worse when moving the thumb away from the index finger, against resistance.  Pain with pinching or gripping.  Locking or catching of the thumb.  Limited motion of the thumb.  Crackling sound (crepitation) when the tendon or thumb is moved or touched.  Fluid-filled cyst in the area of the base of the thumb. CAUSES   Tenosynovitis is often linked with overuse of the wrist.  Tenosynovitis may be caused by repeated injury to the thumb muscle and tendon units, and with repeated motions of the hand and wrist, due to friction of the tendon within the lining (sheath).  Tenosynovitis may also be due to a sudden increase in activity or change in activity. RISK INCREASES WITH:  Sports that involve repeated hand and wrist motions (golf, bowling, tennis, squash, racquetball).  Heavy labor.  Poor physical wrist strength and flexibility.  Failure to warm up properly before practice or play.  Male gender.  New mothers who hold their baby's head for long periods or lift infants with thumbs in the infant's armpit (axilla). PREVENTION  Warm up and stretch properly before practice or competition.  Allow enough time for rest and recovery between practices and competition.  Maintain appropriate conditioning:  Cardiovascular fitness.  Forearm, wrist, and hand flexibility.  Muscle strength and endurance.  Use proper exercise technique. PROGNOSIS  This condition is usually curable within 6 weeks, if treated properly with  non-surgical treatment and resting of the affected area.  RELATED COMPLICATIONS   Longer healing time if not properly treated or if not given enough time to heal.  Chronic inflammation, causing recurring symptoms of tenosynovitis. Permanent pain or restriction of movement.  Risks of surgery: infection, bleeding, injury to nerves (numbness of the thumb), continued pain, incomplete release of the tendon sheath, recurring symptoms, cutting of the tendons, tendons sliding out of position, weakness of the thumb, thumb stiffness. TREATMENT  First, treatment involves the use of medicine and ice, to reduce pain and inflammation. Patients are encouraged to stop or modify activities that aggravate the injury. Stretching and strengthening exercises may be advised. Exercises may be completed at home or with a therapist. You may be fitted with a brace or splint, to limit motion and allow the injury to heal. Your caregiver may also choose to give you a corticosteroid injection, to reduce the pain and inflammation. If non-surgical treatment is not successful, surgery may be needed. Most tenosynovitis surgeries are done as outpatient procedures (you go home the same day). Surgery may involve local, regional (whole arm), or general anesthesia.  MEDICATION   If pain medicine is  needed, nonsteroidal anti-inflammatory medicines (aspirin and ibuprofen), or other minor pain relievers (acetaminophen), are often advised.  Do not take pain medicine for 7 days before surgery.  Prescription pain relievers are often prescribed only after surgery. Use only as directed and only as much as you need.  Corticosteroid injections may be given if your caregiver thinks they are needed. There is a limited number of times these injections may be given. COLD THERAPY   Cold treatment (icing) should be applied for 10 to 15 minutes every 2 to 3 hours for inflammation and pain, and immediately after activity that aggravates your symptoms.  Use ice packs or an ice massage. SEEK MEDICAL CARE IF:   Symptoms get worse or do not improve in 2 to 4 weeks, despite treatment.  You experience pain, numbness, or coldness in the hand.  Blue, gray, or dark color appears in the fingernails.  Any of the following occur after surgery: increased pain, swelling, redness, drainage of fluids, bleeding in the affected area, or signs of infection.  New, unexplained symptoms develop. (Drugs used in treatment may produce side effects.) Document Released: 06/25/2005 Document Revised: 09/17/2011 Document Reviewed: 10/07/2008 [facility] Patient Information 2013 [place], [place].

## 2012-05-03 NOTE — Progress Notes (Signed)
Subjective:     Patient ID: George [name], male   DOB: [birth year], 46 y.o.   MRN: [id number]  HPI 46 yo M presents with complaints of L hand pain x 1 month. Pain is located in radial/volar aspect of wrist. Pain is sharp. Pain is exacerbated by gripping and flexion of thumb. He has pain throughout the day. He denies waking up with pain at night. Pain has not been improved with OTC pain medicine and vicodin.   Review of Systems As per HPI  Objective:   Physical Exam BP 132/77  Pulse 71  Temp 98.1 F (36.7 C) (Oral)  Ht 6' (1.829 m)  Wt 236 lb (107.049 kg)  BMI 32.01 kg/m2 General appearance: alert, cooperative and no distress Extremities:  Wrist: Inspection normal with no visible erythema or swelling. ROM smooth and normal with good flexion and extension and ulnar/radial deviation that is symmetrical with opposite wrist. Palpation produces sharp pain over base of fist metacarpal. Palpation is normal over metacarpals, navicular, lunate, and TFCC; tendons without tenderness/ swelling Strength 5/5 in all directions without pain. Negative Finkelstein, tinel's and phalens.    Assessment and Plan:

## 2012-05-04 DIAGNOSIS — M654 Radial styloid tenosynovitis [de Quervain]: Secondary | ICD-10-CM | POA: Insufficient documentation

## 2012-05-04 NOTE — Assessment & Plan Note (Signed)
A: De Quervain's tenosynovitis vs carpal tunnel L hand.  P: Brace x 2 weeks  Ice  nsaid  F/u steroid injection if conservative therapy does not reduce symptoms.

## 2012-08-15 ENCOUNTER — Emergency Department (HOSPITAL_COMMUNITY): Payer: Medicaid Other

## 2012-08-15 ENCOUNTER — Emergency Department (HOSPITAL_COMMUNITY)
Admission: EM | Admit: 2012-08-15 | Discharge: 2012-08-15 | Disposition: A | Discharge status: Home / Self Care | Payer: Medicaid Other | Attending: Emergency Medicine | Admitting: Emergency Medicine

## 2012-08-15 ENCOUNTER — Encounter (HOSPITAL_COMMUNITY): Payer: Self-pay

## 2012-08-15 DIAGNOSIS — Z8659 Personal history of other mental and behavioral disorders: Secondary | ICD-10-CM | POA: Insufficient documentation

## 2012-08-15 DIAGNOSIS — Z8719 Personal history of other diseases of the digestive system: Secondary | ICD-10-CM | POA: Insufficient documentation

## 2012-08-15 DIAGNOSIS — R079 Chest pain, unspecified: Secondary | ICD-10-CM | POA: Insufficient documentation

## 2012-08-15 DIAGNOSIS — Z8679 Personal history of other diseases of the circulatory system: Secondary | ICD-10-CM | POA: Insufficient documentation

## 2012-08-15 DIAGNOSIS — Z87828 Personal history of other (healed) physical injury and trauma: Secondary | ICD-10-CM | POA: Insufficient documentation

## 2012-08-15 DIAGNOSIS — Z8739 Personal history of other diseases of the musculoskeletal system and connective tissue: Secondary | ICD-10-CM | POA: Insufficient documentation

## 2012-08-15 DIAGNOSIS — Z7982 Long term (current) use of aspirin: Secondary | ICD-10-CM | POA: Insufficient documentation

## 2012-08-15 DIAGNOSIS — Z9889 Other specified postprocedural states: Secondary | ICD-10-CM | POA: Insufficient documentation

## 2012-08-15 DIAGNOSIS — F17200 Nicotine dependence, unspecified, uncomplicated: Secondary | ICD-10-CM | POA: Insufficient documentation

## 2012-08-15 DIAGNOSIS — I2510 Atherosclerotic heart disease of native coronary artery without angina pectoris: Secondary | ICD-10-CM | POA: Insufficient documentation

## 2012-08-15 DIAGNOSIS — R0602 Shortness of breath: Secondary | ICD-10-CM | POA: Insufficient documentation

## 2012-08-15 DIAGNOSIS — Z79899 Other long term (current) drug therapy: Secondary | ICD-10-CM | POA: Insufficient documentation

## 2012-08-15 DIAGNOSIS — I252 Old myocardial infarction: Secondary | ICD-10-CM | POA: Insufficient documentation

## 2012-08-15 LAB — CBC WITH DIFFERENTIAL/PLATELET
Basophils Absolute: 0.1 10*3/uL (ref 0.0–0.1)
Basophils Relative: 1 % (ref 0–1)
Eosinophils Absolute: 0.4 10*3/uL (ref 0.0–0.7)
HCT: 41.9 % (ref 39.0–52.0)
Hemoglobin: 14.8 g/dL (ref 13.0–17.0)
MCH: 30.1 pg (ref 26.0–34.0)
MCHC: 35.3 g/dL (ref 30.0–36.0)
Monocytes Absolute: 0.8 10*3/uL (ref 0.1–1.0)
Monocytes Relative: 11 % (ref 3–12)
RDW: 12.7 % (ref 11.5–15.5)

## 2012-08-15 LAB — BASIC METABOLIC PANEL
BUN: 14 mg/dL (ref 6–23)
Calcium: 9.4 mg/dL (ref 8.4–10.5)
Creatinine, Ser: 1.15 mg/dL (ref 0.50–1.35)
GFR calc Af Amer: 85 mL/min — ABNORMAL LOW (ref >90)
GFR calc non Af Amer: 74 mL/min — ABNORMAL LOW (ref >90)

## 2012-08-15 LAB — POCT I-STAT TROPONIN I: Troponin i, poc: 0 ng/mL (ref 0.00–0.08)

## 2012-08-15 MED ORDER — ASPIRIN 81 MG PO TABS: 81.0000 mg | ORAL_TABLET | Freq: Every day | ORAL | Status: DC

## 2012-08-15 MED ORDER — NITROGLYCERIN 0.4 MG SL SUBL: 0.4000 mg | SUBLINGUAL_TABLET | SUBLINGUAL | Status: DC | PRN

## 2012-08-15 MED ORDER — NITROGLYCERIN 2 % TD OINT: 1.0000 [in_us] | TOPICAL_OINTMENT | Freq: Once | TRANSDERMAL | Status: DC

## 2012-08-15 MED ORDER — METOPROLOL TARTRATE 12.5 MG HALF TABLET: 12.5000 mg | ORAL_TABLET | Freq: Two times a day (BID) | ORAL | Status: DC

## 2012-08-15 MED ORDER — ASPIRIN 81 MG PO CHEW
324.0000 mg | CHEWABLE_TABLET | Freq: Once | ORAL | Status: AC
  Administered 2012-08-15: 324 mg via ORAL
  Filled 2012-08-15: qty 4

## 2012-08-15 MED ORDER — NITROGLYCERIN 2 % TD OINT
1.0000 [in_us] | TOPICAL_OINTMENT | Freq: Once | TRANSDERMAL | Status: AC
  Administered 2012-08-15: 1 [in_us] via TOPICAL
  Filled 2012-08-15: qty 1

## 2012-08-15 MED ORDER — SIMVASTATIN 40 MG PO TABS: 40.0000 mg | ORAL_TABLET | Freq: Every day | ORAL | Status: DC

## 2012-08-15 MED ORDER — ALPRAZOLAM 0.25 MG PO TABS: 0.2500 mg | ORAL_TABLET | Freq: Three times a day (TID) | ORAL | Status: DC | PRN

## 2012-08-15 NOTE — ED Notes (Signed)
Pt presents with 2 week h/o chest pain.  Pt reports pain is constant, will lessen on it's own and does not radiate.  Last MI with stent placed 06/2010.  +shortness of breath and fatigue
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
## 2012-08-15 NOTE — ED Notes (Signed)
Old ekg found in muse.  Included with ekg done today.

## 2012-08-15 NOTE — ED Notes (Signed)
Patient in NAD at this time, cardiology resident at bedside

## 2012-08-15 NOTE — ED Notes (Signed)
Cardiologist at bedside.  

## 2012-08-15 NOTE — Discharge Instructions (Signed)
Take your medicines as prescribed.   Your cardiologist ordered xanax for you to be picked up at your pharmacy.   Follow up with your cardiologist.   Return to ER if you have worse chest pain, shortness of breath.

## 2012-08-15 NOTE — ED Notes (Signed)
Patient nitro ointment removed per order,
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
## 2012-08-18 ENCOUNTER — Encounter (HOSPITAL_COMMUNITY): Payer: Medicaid Other

## 2012-08-19 ENCOUNTER — Ambulatory Visit (HOSPITAL_COMMUNITY): Payer: Medicaid Other | Attending: Cardiology | Admitting: Radiology

## 2012-08-19 VITALS — BP 119/77 | Ht 72.0 in | Wt 243.0 lb

## 2012-08-19 DIAGNOSIS — R5381 Other malaise: Secondary | ICD-10-CM | POA: Insufficient documentation

## 2012-08-19 DIAGNOSIS — Z87891 Personal history of nicotine dependence: Secondary | ICD-10-CM | POA: Insufficient documentation

## 2012-08-19 DIAGNOSIS — R0789 Other chest pain: Secondary | ICD-10-CM | POA: Insufficient documentation

## 2012-08-19 DIAGNOSIS — R0989 Other specified symptoms and signs involving the circulatory and respiratory systems: Secondary | ICD-10-CM | POA: Insufficient documentation

## 2012-08-19 DIAGNOSIS — R0609 Other forms of dyspnea: Secondary | ICD-10-CM | POA: Insufficient documentation

## 2012-08-19 DIAGNOSIS — R079 Chest pain, unspecified: Secondary | ICD-10-CM

## 2012-08-19 DIAGNOSIS — Z8249 Family history of ischemic heart disease and other diseases of the circulatory system: Secondary | ICD-10-CM | POA: Insufficient documentation

## 2012-08-19 DIAGNOSIS — I2510 Atherosclerotic heart disease of native coronary artery without angina pectoris: Secondary | ICD-10-CM

## 2012-08-19 MED ORDER — TECHNETIUM TC 99M SESTAMIBI GENERIC - CARDIOLITE
32.9000 | Freq: Once | INTRAVENOUS | Status: AC | PRN
  Administered 2012-08-19: 32.9 via INTRAVENOUS

## 2012-08-19 MED ORDER — TECHNETIUM TC 99M SESTAMIBI GENERIC - CARDIOLITE
11.0000 | Freq: Once | INTRAVENOUS | Status: AC | PRN
  Administered 2012-08-19: 11 via INTRAVENOUS

## 2012-08-19 MED ORDER — REGADENOSON 0.4 MG/5ML IV SOLN
0.4000 mg | Freq: Once | INTRAVENOUS | Status: AC
  Administered 2012-08-19: 0.4 mg via INTRAVENOUS

## 2012-08-19 NOTE — Progress Notes (Addendum)
  [facility] SITE 3 NUCLEAR MED [address] [place], [place] [address] [phone]    Cardiology Nuclear Med Study  George [name] is a 47 y.o. male     MRN : [id number]     DOB: [birth year]  Procedure Date: 08/19/2012  Nuclear Med Background Indication for Stress Test:  Evaluation for Ischemia and Post Hospital:08/15/12 ER with Chest pain/dyspnea and (-) troponin History:  '10 MPS: EF: 55% NL 12/11 MI-CFX ECHO: EF: 60-65% mod LVH, 01/12: Heart Cath: EF: 59% LAD 60% CFX 30% OM 99%-PCI then 60% Cardiac Risk Factors: Family History - CAD and History of Smoking  Symptoms:  Chest Pain, Chest Pressure, DOE and Fatigue   Nuclear Pre-Procedure Caffeine/Decaff Intake:  9:00pm NPO After: 12:00am   Lungs:  clear O2 Sat: 96% on room air. IV 0.9% NS with Angio Cath:  20g  IV Site: R Hand  IV Started by:  Cathlyn Parsons, RN  Chest Size (in):  48 Cup Size: n/a  Height: 6' (1.829 m)  Weight:  243 lb (110.224 kg)  BMI:  Body mass index is 32.95 kg/(m^2). Tech Comments:  Metoprolol taken this am    Nuclear Med Study 1 or 2 day study: 1 day  Stress Test Type:  Treadmill/Lexiscan  Reading MD: Cassell Clement, MD  Order Authorizing Provider:  Fransico Meadow  Resting Radionuclide: Technetium 52m Sestamibi  Resting Radionuclide Dose: 11.0 mCi   Stress Radionuclide:  Technetium 19m Sestamibi  Stress Radionuclide Dose: 32.9 mCi           Stress Protocol Rest HR: 48 Stress HR: 91  Rest BP: 119/77 Stress BP: 142/82  Exercise Time (min): n/a METS: n/a   Predicted Max HR: 172 bpm % Max HR: 52.91 bpm Rate Pressure Product: 95284   Dose of Adenosine (mg):  n/a Dose of Lexiscan: 0.4 mg  Dose of Atropine (mg): n/a Dose of Dobutamine: n/a mcg/kg/min (at max HR)  Stress Test Technologist: Milana Na, EMT-P  Nuclear Technologist:  Domenic Polite, CNMT     Rest Procedure:  Myocardial perfusion imaging was performed at rest 45 minutes following the intravenous administration of  Technetium 30m Sestamibi. Rest ECG: NSR - Normal EKG  Stress Procedure:  The patient received IV Lexiscan 0.4 mg over 15-seconds with concurrent low level exercise and then Technetium 36m Sestamibi was injected at 30-seconds while the patient continued walking one more minute. The patient had tingling hands with Lexiscan. Quantitative spect images were obtained after a 45-minute delay. Stress ECG: No significant change from baseline ECG  QPS Raw Data Images:  Normal; no motion artifact; normal heart/lung ratio. Stress Images:  Normal homogeneous uptake in all areas of the myocardium. Rest Images:  Normal homogeneous uptake in all areas of the myocardium. Subtraction (SDS):  No evidence of ischemia. Transient Ischemic Dilatation (Normal <1.22):  0.97 Lung/Heart Ratio (Normal <0.45):  0.48  Quantitative Gated Spect Images QGS EDV:  131 ml QGS ESV:  60 ml  Impression Exercise Capacity:  Lexiscan with low level exercise. BP Response:  Normal blood pressure response. Clinical Symptoms:  No significant symptoms noted. ECG Impression:  No significant ST segment change suggestive of ischemia. Comparison with Prior Nuclear Study: No images to compare  Overall Impression:  Normal stress nuclear study.  LV Ejection Fraction: 54%.  LV Wall Motion:  NL LV Function; NL Wall Motion  Cassell Clement  Normal study  [name] 08/20/2012 1:27 PM

## 2012-08-25 NOTE — Progress Notes (Signed)
LMTCB

## 2012-08-25 NOTE — Progress Notes (Signed)
Pt advised. Pt offered appt. Pt declined at the present time.

## 2012-09-03 ENCOUNTER — Telehealth: Payer: Self-pay | Admitting: Family Medicine

## 2012-09-03 DIAGNOSIS — M545 Low back pain: Secondary | ICD-10-CM

## 2012-09-03 NOTE — Telephone Encounter (Signed)
Ortho referral placed

## 2012-09-03 NOTE — Telephone Encounter (Signed)
Patient is calling requesting a referral to Dr. Lunette Stands regarding his back.  He would really like this done today that has gotten to the point where he is having difficulty walking.  His daughter has an appt tomorrow with Dr. Charlett Blake and he would like to be seen at that time as well.

## 2012-09-15 ENCOUNTER — Telehealth: Payer: Self-pay | Admitting: Cardiology

## 2012-09-15 NOTE — Telephone Encounter (Signed)
Spoke with Debbie. Pt needs lumbar injection by Dr Maurice Small. He is requesting pt hold aspirin for 3-5 days prior to injection. She is going to fax request with that information  to Dr Shirlee Latch today.

## 2012-09-15 NOTE — Telephone Encounter (Signed)
Faxed request to discontinue aspirin here for   Dr Shirlee Latch to review.

## 2012-09-15 NOTE — Telephone Encounter (Signed)
New Problem:    Called in wanting to know what the status was of a release fo the patient to hold their aspirin prior to an upcoming injection.

## 2012-09-17 NOTE — Telephone Encounter (Signed)
Per Dr Wilmon Pali to hold aspirin for 3-5 days prior to injection. Drr Shirlee Latch  completed request to hold aspirin 3-5 days prior to injection. I spoke with Molli Hazard at [facility]. He is aware fax request has been completed and returned to HIM to be faxed to [employer].

## 2012-12-09 ENCOUNTER — Encounter: Payer: Self-pay | Admitting: Family Medicine

## 2013-05-27 ENCOUNTER — Encounter: Payer: Self-pay | Admitting: Nurse Practitioner

## 2013-05-27 ENCOUNTER — Ambulatory Visit (INDEPENDENT_AMBULATORY_CARE_PROVIDER_SITE_OTHER): Payer: Medicaid Other | Admitting: Nurse Practitioner

## 2013-05-27 VITALS — BP 140/90 | HR 50 | Ht 72.0 in | Wt 236.8 lb

## 2013-05-27 DIAGNOSIS — I10 Essential (primary) hypertension: Secondary | ICD-10-CM

## 2013-05-27 DIAGNOSIS — E785 Hyperlipidemia, unspecified: Secondary | ICD-10-CM

## 2013-05-27 LAB — LIPID PANEL
Cholesterol: 175 mg/dL (ref 0–200)
HDL: 43.9 mg/dL (ref >39.00)
LDL Cholesterol: 93 mg/dL (ref 0–99)
Total CHOL/HDL Ratio: 4
Triglycerides: 189 mg/dL — ABNORMAL HIGH (ref 0.0–149.0)
VLDL: 37.8 mg/dL (ref 0.0–40.0)

## 2013-05-27 LAB — HEPATIC FUNCTION PANEL
ALT: 43 U/L (ref 0–53)
AST: 25 U/L (ref 0–37)
Albumin: 4.2 g/dL (ref 3.5–5.2)
Alkaline Phosphatase: 80 U/L (ref 39–117)
Bilirubin, Direct: 0 mg/dL (ref 0.0–0.3)
Total Bilirubin: 0.9 mg/dL (ref 0.3–1.2)
Total Protein: 7.2 g/dL (ref 6.0–8.3)

## 2013-05-27 LAB — BASIC METABOLIC PANEL
BUN: 12 mg/dL (ref 6–23)
CO2: 29 mEq/L (ref 19–32)
Calcium: 9.6 mg/dL (ref 8.4–10.5)
Chloride: 103 mEq/L (ref 96–112)
Creatinine, Ser: 1.1 mg/dL (ref 0.4–1.5)
GFR: 79.58 mL/min (ref >60.00)
Glucose, Bld: 78 mg/dL (ref 70–99)
Potassium: 3.9 mEq/L (ref 3.5–5.1)
Sodium: 137 mEq/L (ref 135–145)

## 2013-05-27 MED ORDER — LISINOPRIL 5 MG PO TABS: 5.0000 mg | ORAL_TABLET | Freq: Every day | ORAL | Status: DC

## 2013-05-27 NOTE — Patient Instructions (Addendum)
I am starting you on Lisinopril 5 mg a day - this is for your blood pressure  Try to cut back on your coffee  We need to check labs today  I will see you in a month with lab work (you do not need to fast)  Monitor your BP at home - keep a diary - bring your cuff for me to check - goal is less than 135/85  Call the [facility] office at [phone] if you have any questions, problems or concerns.
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
## 2013-06-20 ENCOUNTER — Inpatient Hospital Stay (HOSPITAL_COMMUNITY)
Admission: EM | Admit: 2013-06-20 | Discharge: 2013-06-23 | DRG: 392 | Disposition: A | Discharge status: Home / Self Care | Payer: Medicaid Other | Attending: Cardiology | Admitting: Cardiology

## 2013-06-20 ENCOUNTER — Encounter (HOSPITAL_COMMUNITY): Payer: Self-pay | Admitting: Emergency Medicine

## 2013-06-20 DIAGNOSIS — K589 Irritable bowel syndrome without diarrhea: Secondary | ICD-10-CM | POA: Diagnosis present

## 2013-06-20 DIAGNOSIS — I959 Hypotension, unspecified: Secondary | ICD-10-CM | POA: Diagnosis present

## 2013-06-20 DIAGNOSIS — E669 Obesity, unspecified: Secondary | ICD-10-CM | POA: Diagnosis present

## 2013-06-20 DIAGNOSIS — K219 Gastro-esophageal reflux disease without esophagitis: Principal | ICD-10-CM | POA: Diagnosis present

## 2013-06-20 DIAGNOSIS — G8929 Other chronic pain: Secondary | ICD-10-CM | POA: Diagnosis present

## 2013-06-20 DIAGNOSIS — M545 Low back pain, unspecified: Secondary | ICD-10-CM | POA: Diagnosis present

## 2013-06-20 DIAGNOSIS — R079 Chest pain, unspecified: Secondary | ICD-10-CM

## 2013-06-20 DIAGNOSIS — E785 Hyperlipidemia, unspecified: Secondary | ICD-10-CM | POA: Diagnosis present

## 2013-06-20 DIAGNOSIS — Y831 Surgical operation with implant of artificial internal device as the cause of abnormal reaction of the patient, or of later complication, without mention of misadventure at the time of the procedure: Secondary | ICD-10-CM | POA: Diagnosis present

## 2013-06-20 DIAGNOSIS — Z87891 Personal history of nicotine dependence: Secondary | ICD-10-CM

## 2013-06-20 DIAGNOSIS — I498 Other specified cardiac arrhythmias: Secondary | ICD-10-CM | POA: Diagnosis present

## 2013-06-20 DIAGNOSIS — I2510 Atherosclerotic heart disease of native coronary artery without angina pectoris: Secondary | ICD-10-CM | POA: Diagnosis present

## 2013-06-20 DIAGNOSIS — T82897A Other specified complication of cardiac prosthetic devices, implants and grafts, initial encounter: Secondary | ICD-10-CM | POA: Diagnosis present

## 2013-06-20 DIAGNOSIS — I2582 Chronic total occlusion of coronary artery: Secondary | ICD-10-CM | POA: Diagnosis present

## 2013-06-20 DIAGNOSIS — Z6831 Body mass index (BMI) 31.0-31.9, adult: Secondary | ICD-10-CM

## 2013-06-20 DIAGNOSIS — I252 Old myocardial infarction: Secondary | ICD-10-CM

## 2013-06-20 DIAGNOSIS — F17200 Nicotine dependence, unspecified, uncomplicated: Secondary | ICD-10-CM | POA: Diagnosis present

## 2013-06-20 HISTORY — DX: Hyperlipidemia, unspecified: E78.5

## 2013-06-20 HISTORY — DX: Personal history of nicotine dependence: Z87.891

## 2013-06-20 HISTORY — DX: Obesity, unspecified: E66.9

## 2013-06-20 HISTORY — DX: Personal history of other medical treatment: Z92.89

## 2013-06-20 LAB — POCT I-STAT TROPONIN I: Troponin i, poc: 0 ng/mL (ref 0.00–0.08)

## 2013-06-20 LAB — BASIC METABOLIC PANEL
BUN: 11 mg/dL (ref 6–23)
Chloride: 104 mEq/L (ref 96–112)
GFR calc Af Amer: >90 mL/min (ref >90)
Glucose, Bld: 90 mg/dL (ref 70–99)
Potassium: 3.9 mEq/L (ref 3.5–5.1)

## 2013-06-20 LAB — POCT I-STAT, CHEM 8
BUN: 12 mg/dL (ref 6–23)
Calcium, Ion: 1.19 mmol/L (ref 1.12–1.23)
Chloride: 105 mEq/L (ref 96–112)
Potassium: 4.3 mEq/L (ref 3.5–5.1)

## 2013-06-20 LAB — CBC
HCT: 42.7 % (ref 39.0–52.0)
Hemoglobin: 15.1 g/dL (ref 13.0–17.0)
WBC: 7.2 10*3/uL (ref 4.0–10.5)

## 2013-06-20 MED ORDER — ONDANSETRON HCL 4 MG/2ML IJ SOLN: 4.0000 mg | Freq: Four times a day (QID) | INTRAMUSCULAR | Status: DC | PRN

## 2013-06-20 MED ORDER — NITROGLYCERIN 0.4 MG SL SUBL
0.4000 mg | SUBLINGUAL_TABLET | SUBLINGUAL | Status: DC | PRN
  Administered 2013-06-20 (×2): 0.4 mg via SUBLINGUAL

## 2013-06-20 MED ORDER — PROMETHAZINE HCL 25 MG RE SUPP: 12.5000 mg | Freq: Four times a day (QID) | RECTAL | Status: DC | PRN

## 2013-06-20 MED ORDER — PROMETHAZINE HCL 25 MG/ML IJ SOLN: 12.5000 mg | Freq: Four times a day (QID) | INTRAMUSCULAR | Status: DC | PRN

## 2013-06-20 MED ORDER — ISOSORBIDE MONONITRATE 15 MG HALF TABLET
15.0000 mg | ORAL_TABLET | Freq: Every day | ORAL | Status: DC
  Administered 2013-06-21 – 2013-06-23 (×3): 15 mg via ORAL
  Filled 2013-06-20 (×3): qty 1

## 2013-06-20 MED ORDER — NITROGLYCERIN 0.4 MG SL SUBL: 0.4000 mg | SUBLINGUAL_TABLET | SUBLINGUAL | Status: DC | PRN

## 2013-06-20 MED ORDER — PROMETHAZINE HCL 12.5 MG PO TABS: 12.5000 mg | ORAL_TABLET | Freq: Four times a day (QID) | ORAL | Status: DC | PRN

## 2013-06-20 MED ORDER — METOPROLOL TARTRATE 12.5 MG HALF TABLET
12.5000 mg | ORAL_TABLET | Freq: Two times a day (BID) | ORAL | Status: DC
  Administered 2013-06-21 – 2013-06-22 (×2): 12.5 mg via ORAL
  Filled 2013-06-20 (×6): qty 1

## 2013-06-20 MED ORDER — ACETAMINOPHEN 325 MG PO TABS: 650.0000 mg | ORAL_TABLET | ORAL | Status: DC | PRN

## 2013-06-20 MED ORDER — SIMVASTATIN 40 MG PO TABS
40.0000 mg | ORAL_TABLET | Freq: Every day | ORAL | Status: DC
  Administered 2013-06-20 – 2013-06-21 (×2): 40 mg via ORAL
  Filled 2013-06-20 (×3): qty 1

## 2013-06-20 MED ORDER — MORPHINE SULFATE 2 MG/ML IJ SOLN: 2.0000 mg | INTRAMUSCULAR | Status: DC | PRN

## 2013-06-20 MED ORDER — ALPRAZOLAM 0.25 MG PO TABS
0.2500 mg | ORAL_TABLET | Freq: Three times a day (TID) | ORAL | Status: DC | PRN
  Administered 2013-06-21 – 2013-06-22 (×2): 0.25 mg via ORAL
  Filled 2013-06-20 (×2): qty 1

## 2013-06-20 MED ORDER — ENOXAPARIN SODIUM 40 MG/0.4ML ~~LOC~~ SOLN
40.0000 mg | SUBCUTANEOUS | Status: DC
  Administered 2013-06-21: 40 mg via SUBCUTANEOUS
  Filled 2013-06-20 (×3): qty 0.4

## 2013-06-20 MED ORDER — HYDROCODONE-ACETAMINOPHEN 10-325 MG PO TABS
1.0000 | ORAL_TABLET | ORAL | Status: DC | PRN
  Administered 2013-06-21: 2 via ORAL
  Administered 2013-06-22: 1 via ORAL
  Filled 2013-06-20: qty 2
  Filled 2013-06-20: qty 1

## 2013-06-20 MED ORDER — ASPIRIN 325 MG PO TABS
325.0000 mg | ORAL_TABLET | Freq: Every day | ORAL | Status: DC
  Administered 2013-06-21 – 2013-06-23 (×2): 325 mg via ORAL
  Filled 2013-06-20 (×3): qty 1

## 2013-06-20 MED ORDER — LISINOPRIL 5 MG PO TABS
5.0000 mg | ORAL_TABLET | Freq: Every day | ORAL | Status: DC
  Administered 2013-06-21: 5 mg via ORAL
  Filled 2013-06-20 (×2): qty 1

## 2013-06-20 NOTE — ED Notes (Signed)
Cardiology at bedside.
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
## 2013-06-20 NOTE — ED Notes (Signed)
Patient started with chest pain the radiates to his right arm to his elbow.  Pain started about 430pm today. +weakness  Has a history of MI with stent placement July 10, 2009

## 2013-06-20 NOTE — ED Notes (Signed)
Attempted to call report, nurse unavailable.

## 2013-06-21 DIAGNOSIS — I517 Cardiomegaly: Secondary | ICD-10-CM

## 2013-06-21 DIAGNOSIS — R079 Chest pain, unspecified: Secondary | ICD-10-CM

## 2013-06-21 LAB — CBC
HCT: 40.2 % (ref 39.0–52.0)
HCT: 41 % (ref 39.0–52.0)
Hemoglobin: 14.1 g/dL (ref 13.0–17.0)
Hemoglobin: 14.4 g/dL (ref 13.0–17.0)
MCH: 30.7 pg (ref 26.0–34.0)
MCHC: 35.1 g/dL (ref 30.0–36.0)
MCV: 87.4 fL (ref 78.0–100.0)
MCV: 87.4 fL (ref 78.0–100.0)
Platelets: 217 10*3/uL (ref 150–400)
RBC: 4.69 MIL/uL (ref 4.22–5.81)
WBC: 7.8 10*3/uL (ref 4.0–10.5)

## 2013-06-21 LAB — BASIC METABOLIC PANEL
BUN: 13 mg/dL (ref 6–23)
CO2: 26 mEq/L (ref 19–32)
Calcium: 8.8 mg/dL (ref 8.4–10.5)
Chloride: 104 mEq/L (ref 96–112)
Creatinine, Ser: 1.05 mg/dL (ref 0.50–1.35)
GFR calc Af Amer: >90 mL/min (ref >90)
GFR calc non Af Amer: 82 mL/min — ABNORMAL LOW (ref >90)
Glucose, Bld: 93 mg/dL (ref 70–99)
Potassium: 4 mEq/L (ref 3.5–5.1)

## 2013-06-21 LAB — TROPONIN I: Troponin I: <0.3 ng/mL (ref <0.30)

## 2013-06-21 LAB — CREATININE, SERUM: GFR calc Af Amer: >90 mL/min (ref >90)

## 2013-06-21 LAB — LIPID PANEL
Cholesterol: 201 mg/dL — ABNORMAL HIGH (ref 0–200)
HDL: 29 mg/dL — ABNORMAL LOW (ref >39)
Total CHOL/HDL Ratio: 6.9 RATIO
Triglycerides: 361 mg/dL — ABNORMAL HIGH (ref <150)
VLDL: 72 mg/dL — ABNORMAL HIGH (ref 0–40)

## 2013-06-21 LAB — TSH: TSH: 1.218 u[IU]/mL (ref 0.350–4.500)

## 2013-06-21 LAB — T4, FREE: Free T4: 1.01 ng/dL (ref 0.80–1.80)

## 2013-06-21 MED ORDER — ASPIRIN 81 MG PO CHEW
81.0000 mg | CHEWABLE_TABLET | ORAL | Status: AC
  Administered 2013-06-22: 81 mg via ORAL
  Filled 2013-06-21: qty 1

## 2013-06-21 MED ORDER — SODIUM CHLORIDE 0.9 % IJ SOLN: 3.0000 mL | INTRAMUSCULAR | Status: DC | PRN

## 2013-06-21 MED ORDER — ALUM & MAG HYDROXIDE-SIMETH 200-200-20 MG/5ML PO SUSP
15.0000 mL | Freq: Four times a day (QID) | ORAL | Status: DC | PRN
  Administered 2013-06-21: 15 mL via ORAL
  Filled 2013-06-21: qty 30

## 2013-06-21 MED ORDER — SODIUM CHLORIDE 0.9 % IJ SOLN
3.0000 mL | Freq: Two times a day (BID) | INTRAMUSCULAR | Status: DC
  Administered 2013-06-21 – 2013-06-22 (×2): 3 mL via INTRAVENOUS

## 2013-06-21 MED ORDER — SODIUM CHLORIDE 0.9 % IV SOLN: 250.0000 mL | INTRAVENOUS | Status: DC | PRN

## 2013-06-21 NOTE — Progress Notes (Signed)
Patient ID: George [name], male   DOB: June 04, 1966, 47 y.o.   MRN: [id number]    Subjective:  Denies SSCP, palpitations or Dyspnea Some pain radiating down right arm  Objective:  Filed Vitals:   06/20/13 2100 06/20/13 2145 06/20/13 2241 06/21/13 0608  BP: 95/61 108/65 116/84 106/65  Pulse: 49 45 42 57  Temp:   98.2 F (36.8 C) 98.4 F (36.9 C)  TempSrc:   Oral Oral  Resp: 13 15 16 16   Height:   6' (1.829 m)   Weight:   231 lb 14.4 oz (105.189 kg)   SpO2: 94% 94% 98% 96%    Intake/Output from previous day: No intake or output data in the 24 hours ending 06/21/13 0856  Physical Exam: Affect appropriate Healthy:  appears stated age HEENT: normal Neck supple with no adenopathy JVP normal no bruits no thyromegaly Lungs clear with no wheezing and good diaphragmatic motion Heart:  S1/S2 no murmur, no rub, gallop or click PMI normal Abdomen: benighn, BS positve, no tenderness, no AAA no bruit.  No HSM or HJR Distal pulses intact with no bruits No edema Neuro non-focal Skin warm and dry No muscular weakness   Lab Results: Basic Metabolic Panel:  Recent Labs  40/98/11 1930 06/20/13 1945 06/20/13 2335 06/21/13 0500  NA 139 138  --  139  K 3.9 4.3  --  4.0  CL 104 105  --  104  CO2 25  --   --  26  GLUCOSE 90 88  --  93  BUN 11 12  --  13  CREATININE 1.05 1.10 1.03 1.05  CALCIUM 9.2  --   --  8.8   CBC:  Recent Labs  06/20/13 2335 06/21/13 0500  WBC 7.8 7.2  HGB 14.4 14.1  HCT 41.0 40.2  MCV 87.4 87.4  PLT 217 205   Cardiac Enzymes:  Recent Labs  06/20/13 2335 06/21/13 0500  TROPONINI <0.30 <0.30   Fasting Lipid Panel:  Recent Labs  06/21/13 0500  CHOL 201*  HDL 29*  LDLCALC 100*  TRIG 361*  CHOLHDL 6.9    Imaging: No results found.  Cardiac Studies:  ECG:   SR no acute ischemic changes   Telemetry:  NSR no arrhythmia 06/21/2013   Echo:   Medications:   . aspirin  325 mg Oral Daily  . enoxaparin (LOVENOX) injection  40 mg  Subcutaneous Q24H  . isosorbide mononitrate  15 mg Oral Daily  . lisinopril  5 mg Oral Daily  . metoprolol tartrate  12.5 mg Oral BID  . simvastatin  40 mg Oral QHS       Assessment/Plan:  Chest Pain:  R/O History of RCA stent and moderate residual LAD disease.  Favor cath to risk stratify given known disease and fact he had an MI Shortly after a normal myovue in past.  Continue ASA, beta blocker  LA nitrates added on admission. Add heparin for any more pain Orders written Discussed with patient willing to proceed Chol:  Continue statin ? Add tricor for Triglycerides   Charlton Haws 06/21/2013, 8:56 AM

## 2013-06-21 NOTE — Progress Notes (Signed)
  Echocardiogram 2D Echocardiogram has been performed.  George [name] FRANCES 06/21/2013, 11:16 AM

## 2013-06-22 ENCOUNTER — Encounter (HOSPITAL_COMMUNITY): Payer: Self-pay | Admitting: General Practice

## 2013-06-22 ENCOUNTER — Encounter (HOSPITAL_COMMUNITY)
Admission: EM | Disposition: A | Discharge status: Home / Self Care | Payer: Self-pay | Source: Home / Self Care | Attending: Cardiology

## 2013-06-22 DIAGNOSIS — I10 Essential (primary) hypertension: Secondary | ICD-10-CM

## 2013-06-22 DIAGNOSIS — I2510 Atherosclerotic heart disease of native coronary artery without angina pectoris: Secondary | ICD-10-CM

## 2013-06-22 HISTORY — PX: LEFT HEART CATHETERIZATION WITH CORONARY ANGIOGRAM: SHX5451

## 2013-06-22 SURGERY — LEFT HEART CATHETERIZATION WITH CORONARY ANGIOGRAM
Anesthesia: LOCAL

## 2013-06-22 MED ORDER — HEPARIN SODIUM (PORCINE) 1000 UNIT/ML IJ SOLN
INTRAMUSCULAR | Status: AC
  Filled 2013-06-22: qty 1

## 2013-06-22 MED ORDER — LISINOPRIL 2.5 MG PO TABS
2.5000 mg | ORAL_TABLET | Freq: Every day | ORAL | Status: DC
  Administered 2013-06-23: 2.5 mg via ORAL
  Filled 2013-06-22 (×2): qty 1

## 2013-06-22 MED ORDER — MIDAZOLAM HCL 2 MG/2ML IJ SOLN
INTRAMUSCULAR | Status: AC
  Filled 2013-06-22: qty 2

## 2013-06-22 MED ORDER — ATORVASTATIN CALCIUM 40 MG PO TABS
40.0000 mg | ORAL_TABLET | Freq: Every day | ORAL | Status: DC
  Administered 2013-06-22: 40 mg via ORAL
  Filled 2013-06-22 (×2): qty 1

## 2013-06-22 MED ORDER — PANTOPRAZOLE SODIUM 40 MG PO TBEC
40.0000 mg | DELAYED_RELEASE_TABLET | Freq: Every day | ORAL | Status: DC
  Administered 2013-06-22 – 2013-06-23 (×2): 40 mg via ORAL
  Filled 2013-06-22: qty 1

## 2013-06-22 MED ORDER — SODIUM CHLORIDE 0.9 % IV SOLN
INTRAVENOUS | Status: AC
  Administered 2013-06-22: 17:00:00 via INTRAVENOUS

## 2013-06-22 MED ORDER — ACETAMINOPHEN 325 MG PO TABS: 650.0000 mg | ORAL_TABLET | ORAL | Status: DC | PRN

## 2013-06-22 MED ORDER — SODIUM CHLORIDE 0.9 % IV SOLN
INTRAVENOUS | Status: DC
  Administered 2013-06-22: 07:00:00 via INTRAVENOUS

## 2013-06-22 MED ORDER — HEPARIN (PORCINE) IN NACL 2-0.9 UNIT/ML-% IJ SOLN
INTRAMUSCULAR | Status: AC
  Filled 2013-06-22: qty 1000

## 2013-06-22 MED ORDER — HYDROCODONE-ACETAMINOPHEN 10-325 MG PO TABS
1.0000 | ORAL_TABLET | Freq: Four times a day (QID) | ORAL | Status: DC | PRN
  Administered 2013-06-22: 2 via ORAL
  Filled 2013-06-22: qty 2

## 2013-06-22 MED ORDER — LIDOCAINE HCL (PF) 1 % IJ SOLN
INTRAMUSCULAR | Status: AC
  Filled 2013-06-22: qty 30

## 2013-06-22 MED ORDER — VERAPAMIL HCL 2.5 MG/ML IV SOLN
INTRAVENOUS | Status: AC
  Filled 2013-06-22: qty 2

## 2013-06-22 MED ORDER — FENTANYL CITRATE 0.05 MG/ML IJ SOLN
INTRAMUSCULAR | Status: AC
  Filled 2013-06-22: qty 2

## 2013-06-22 MED ORDER — NITROGLYCERIN 0.2 MG/ML ON CALL CATH LAB
INTRAVENOUS | Status: AC
  Filled 2013-06-22: qty 1

## 2013-06-22 NOTE — Progress Notes (Signed)
Band removed, right radial site level 0.  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  Dressing applied to site.  Will continue to monitor. [place], Mitzi Hansen

## 2013-06-22 NOTE — H&P (View-Only) (Signed)
BP still running in 90's this AM, HR 48. Meds were reduced but BP may still be too low to give. Will hold metoprolol this AM due to bradycardia and lisinopril given that he is also going to receive contrast today from cath so that we do not perpetuate renal insult. Per notes, pt had reported BP running high at home so will need to follow. OK to continue Imdur. Tyja Gortney PA-C

## 2013-06-22 NOTE — CV Procedure (Addendum)
   Cardiac Catheterization Procedure Note  Name: George [name] MRN: [id number] DOB: 1966/06/07  Procedure: Left Heart Cath, Selective Coronary Angiography, LV angiography  Indication:  Unstable angina  Procedural details: The right radial was prepped, draped, and anesthetized with 1% lidocaine. Using modified Seldinger technique, a 5 French sheath was introduced into the right radial artery. Standard Judkins catheters were used for coronary angiography and left ventriculography. Catheter exchanges were performed over a guidewire. There were no immediate procedural complications. The patient was transferred to the post catheterization recovery area for further monitoring.  Procedural Findings:  Hemodynamics:     AO 94/53    LV 94/2   Coronary angiography:   Coronary dominance: Right  Left mainstem:   Normal.  Left anterior descending (LAD):   Ostial 30% stenosis.  Proximal long 60% stenosis.  D1 large and normal.    Left circumflex (LCx):  Proximal AV groove long 25% stenosis.  MOM large with long proximal 25% stenosis and mild diffuse luminal irregularities.  Occluded stent an OM beyond this.    Right coronary artery (RCA):  Diffuse luminal irregularities.  PDA large with ostial 25% stenosis.  PL moderate sized with mid 40% stenosis.  RCA to OM occlusion.  Large RV branch with mild luminal irregularities.    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    Recommendations: Medical management.  Whitley Strycharz 06/22/2013, 4:54 PM

## 2013-06-22 NOTE — Progress Notes (Signed)
UR Completed Oliana Gowens Graves-Bigelow, RN,BSN [phone]  

## 2013-06-22 NOTE — Progress Notes (Signed)
BP still running in 90's this AM, HR 48. Meds were reduced but BP may still be too low to give. Will hold metoprolol this AM due to bradycardia and lisinopril given that he is also going to receive contrast today from cath so that we do not perpetuate renal insult. Per notes, pt had reported BP running high at home so will need to follow. OK to continue Imdur. Shayaan Parke PA-C  

## 2013-06-22 NOTE — Interval H&P Note (Signed)
History and Physical Interval Note:  06/22/2013 4:53 PM  George [name]  has presented today for surgery, with the diagnosis of cp  The various methods of treatment have been discussed with the patient and family. After consideration of risks, benefits and other options for treatment, the patient has consented to  Procedure(s): LEFT HEART CATHETERIZATION WITH CORONARY ANGIOGRAM (N/A) as a surgical intervention .  The patient's history has been reviewed, patient examined, no change in status, stable for surgery.  I have reviewed the patient's chart and labs.  Questions were answered to the patient's satisfaction.     Rollene Rotunda

## 2013-06-22 NOTE — Progress Notes (Signed)
Patient ID: George [name], male   DOB: 1965/10/27, 47 y.o.   MRN: [id number]   SUBJECTIVE: No further chest pain.  Having abdominal discomfort from time to time.   Marland Kitchen aspirin  325 mg Oral Daily  . atorvastatin  40 mg Oral q1800  . enoxaparin (LOVENOX) injection  40 mg Subcutaneous Q24H  . isosorbide mononitrate  15 mg Oral Daily  . lisinopril  2.5 mg Oral Daily  . metoprolol tartrate  12.5 mg Oral BID  . sodium chloride  3 mL Intravenous Q12H      Filed Vitals:   06/21/13 1346 06/21/13 2040 06/22/13 0510 06/22/13 0530  BP: 96/50 95/51 85/38  92/58  Pulse: 52 63 46   Temp: 97.7 F (36.5 C) 98.6 F (37 C) 98.9 F (37.2 C)   TempSrc: Oral Oral Oral   Resp: 16 16 15    Height:      Weight:      SpO2: 94% 94% 99%     Intake/Output Summary (Last 24 hours) at 06/22/13 0817 Last data filed at 06/21/13 2158  Gross per 24 hour  Intake    243 ml  Output      0 ml  Net    243 ml    LABS: Basic Metabolic Panel:  Recent Labs  40/98/11 1930 06/20/13 1945 06/20/13 2335 06/21/13 0500  NA 139 138  --  139  K 3.9 4.3  --  4.0  CL 104 105  --  104  CO2 25  --   --  26  GLUCOSE 90 88  --  93  BUN 11 12  --  13  CREATININE 1.05 1.10 1.03 1.05  CALCIUM 9.2  --   --  8.8   Liver Function Tests: No results found for this basename: AST, ALT, ALKPHOS, BILITOT, PROT, ALBUMIN,  in the last 72 hours No results found for this basename: LIPASE, AMYLASE,  in the last 72 hours CBC:  Recent Labs  06/20/13 2335 06/21/13 0500  WBC 7.8 7.2  HGB 14.4 14.1  HCT 41.0 40.2  MCV 87.4 87.4  PLT 217 205   Cardiac Enzymes:  Recent Labs  06/20/13 2335 06/21/13 0500 06/21/13 1030  TROPONINI <0.30 <0.30 <0.30   BNP: No components found with this basename: POCBNP,  D-Dimer: No results found for this basename: DDIMER,  in the last 72 hours Hemoglobin A1C: No results found for this basename: HGBA1C,  in the last 72 hours Fasting Lipid Panel:  Recent Labs  06/21/13 0500  CHOL 201*    HDL 29*  LDLCALC 100*  TRIG 361*  CHOLHDL 6.9   Thyroid Function Tests:  Recent Labs  06/20/13 2335  TSH 1.218   Anemia Panel: No results found for this basename: VITAMINB12, FOLATE, FERRITIN, TIBC, IRON, RETICCTPCT,  in the last 72 hours  RADIOLOGY: No results found.  PHYSICAL EXAM General: NAD Neck: No JVD, no thyromegaly or thyroid nodule.  Lungs: Clear to auscultation bilaterally with normal respiratory effort. CV: Nondisplaced PMI.  Heart regular S1/S2, no S3/S4, no murmur.  No peripheral edema.  No carotid bruit.  Normal pedal pulses.  Abdomen: Soft, nontender, no hepatosplenomegaly, no distention.  Neurologic: Alert and oriented x 3.  Psych: Normal affect. Extremities: No clubbing or cyanosis.   TELEMETRY: Reviewed telemetry pt in NSR  ASSESSMENT AND PLAN: 47 yo with history of CAD, smoking, GERD, and hypertension presented with chest pain.  Also has chronic abdominal pain with history of IBS.  1. Chest pain: Atypical.  He had a negative stress Cardiolite earlier this year so was set up for LHC today to definitively rule out obstructive CAD as cause for symptoms.  Continue ASA 81 and atorvastatin 40.  2. HTN: BP actually running low today but says it has been high at home.  For now, keep metoprolol and lisinopril at low dose.  3. Smoking: He has quit for 2 months using e-cigarette. 4. Abdominal discomfort: History of IBS, also fair amount of belching.  Try PPI.   If cath unremarkable today, can go home.   George [name] 06/22/2013 8:20 AM

## 2013-06-23 ENCOUNTER — Encounter (HOSPITAL_COMMUNITY): Payer: Self-pay | Admitting: Physician Assistant

## 2013-06-23 DIAGNOSIS — E669 Obesity, unspecified: Secondary | ICD-10-CM | POA: Diagnosis present

## 2013-06-23 DIAGNOSIS — Z87891 Personal history of nicotine dependence: Secondary | ICD-10-CM

## 2013-06-23 DIAGNOSIS — I2510 Atherosclerotic heart disease of native coronary artery without angina pectoris: Secondary | ICD-10-CM

## 2013-06-23 LAB — BASIC METABOLIC PANEL
BUN: 13 mg/dL (ref 6–23)
Calcium: 8.5 mg/dL (ref 8.4–10.5)
Creatinine, Ser: 1.09 mg/dL (ref 0.50–1.35)
GFR calc Af Amer: >90 mL/min (ref >90)
GFR calc non Af Amer: 78 mL/min — ABNORMAL LOW (ref >90)
Glucose, Bld: 90 mg/dL (ref 70–99)
Potassium: 4.4 mEq/L (ref 3.5–5.1)
Sodium: 138 mEq/L (ref 135–145)

## 2013-06-23 LAB — CBC
Hemoglobin: 13.3 g/dL (ref 13.0–17.0)
MCHC: 34.1 g/dL (ref 30.0–36.0)
Platelets: 195 10*3/uL (ref 150–400)
RBC: 4.41 MIL/uL (ref 4.22–5.81)
WBC: 7.8 10*3/uL (ref 4.0–10.5)

## 2013-06-23 MED ORDER — ISOSORBIDE MONONITRATE ER 30 MG PO TB24: 15.0000 mg | ORAL_TABLET | Freq: Every day | ORAL | Status: DC

## 2013-06-23 MED ORDER — ATORVASTATIN CALCIUM 40 MG PO TABS: 40.0000 mg | ORAL_TABLET | Freq: Every day | ORAL | Status: DC

## 2013-06-23 MED ORDER — OMEPRAZOLE 40 MG PO CPDR: 40.0000 mg | DELAYED_RELEASE_CAPSULE | Freq: Every day | ORAL | Status: DC

## 2013-06-23 MED ORDER — ASPIRIN 81 MG PO TABS: 325.0000 mg | ORAL_TABLET | Freq: Every day | ORAL | Status: DC

## 2013-06-23 NOTE — Discharge Summary (Signed)
Discharge Summary   Patient ID: George [name] MRN: [id number], DOB/AGE: 11-19-1965 47 y.o. Admit date: 06/20/2013 D/C date:     06/23/2013  Primary Cardiologist: Dr. Shirlee Latch  Active Problems:   Chest pain - thought to be GI related   GERD   Irritable bowel syndrome   Hyperlipideamia   CAD (coronary artery disease)    Obesity Body mass index is 31.44 kg/(m^2).   History of tobacco abuse -- currently using E-cigarettes    Hospital Course: George [name] is a 47 yo with history of CAD s/p NSTEMI 2012 with DES in LCx and 60% stenosis in LAD, tobacco abuse, GERD, hypertension and IBS who presented to the ED on 06/20/13 with chest pain. Chest pain was described as sharp, 5/10 severity, non exertional and radiated to his left arm. His EKG showed sinus bradycardia with no acute changes. Patient ruled out for a MI, but was scheduled for cardiac cath for further risk stratification given his known disease and previous MI. LHC on 12/15 showed chronic occlusion of OM with right to left collaterals, severe single vessel CAD with occluded circumflex stent and 60% LAD stenosis stable from last cath. Medical management was recommended. Echo showed preserved EF (55-60%) with mild concentric LV hypertrophy.  Patient had an uncomplicated hospital course; however, he did have some issues with hypotension (99/71) and bradycardia (HR in 40s). Metoprolol was held in hospital and will be discontinued upon discharge until re-evaluated as an outpatient. PPI given in hospital relieved chest discomfort and suspect the pain bringing him to the hospital may have been GI related. He has quit smoking for 2 months using an electronic cigarette and he was encouraged to maintain abstinence.   Patient was evaluated by Dr. Shirlee Latch today and deemed ready for discharge. He wil be sent home on  ASA 81, atorvastatin 40, lisinopril 5 daily, Imdur 15 daily, omeprazole 40 daily. BB held secondary to hypotension and bradycardia. Followup with  Dr. Shirlee Latch has been scheduled in 2 weeks. Patient with abnormal lipid panel that may need to be re-evaluated as an outpatient.    Discharge Vitals: Blood pressure 112/59, pulse 40, temperature 97.7 F (36.5 C), temperature source Oral, resp. rate 16, height 6' (1.829 m), weight 231 lb 14.4 oz (105.189 kg), SpO2 98.00%.  Labs: Lab Results  Component Value Date   WBC 7.8 06/23/2013   HGB 13.3 06/23/2013   HCT 39.0 06/23/2013   MCV 88.4 06/23/2013   PLT 195 06/23/2013     Recent Labs Lab 06/23/13 0405  NA 138  K 4.4  CL 105  CO2 26  BUN 13  CREATININE 1.09  CALCIUM 8.5  GLUCOSE 90    Recent Labs  06/20/13 2335 06/21/13 0500 06/21/13 1030  TROPONINI <0.30 <0.30 <0.30   Lab Results  Component Value Date   CHOL 201* 06/21/2013   HDL 29* 06/21/2013   LDLCALC 100* 06/21/2013   TRIG 361* 06/21/2013     Diagnostic Studies/Procedures  Cardiac Catheterization Procedure Note  Name: George [name]  MRN: [id number]  DOB: 1966-03-16  Procedure: Left Heart Cath, Selective Coronary Angiography, LV angiography  Indication: Unstable angina  Procedural details: The right radial was prepped, draped, and anesthetized with 1% lidocaine. Using modified Seldinger technique, a 5 French sheath was introduced into the right radial artery. Standard Judkins catheters were used for coronary angiography and left ventriculography. Catheter exchanges were performed over a guidewire. There were no immediate procedural complications. The patient was transferred to the post catheterization  recovery area for further monitoring.  Procedural Findings:  Hemodynamics:  AO 94/53  LV 94/2  Coronary angiography:  Coronary dominance: Right  Left mainstem: Normal.  Left anterior descending (LAD): Ostial 30% stenosis. Proximal long 60% stenosis. D1 large and normal.  Left circumflex (LCx): Proximal AV groove long 25% stenosis. MOM large with long proximal 25% stenosis and mild diffuse luminal irregularities.  Occluded stent an OM beyond this.  Right coronary artery (RCA): Diffuse luminal irregularities. PDA large with ostial 25% stenosis. PL moderate sized with mid 40% stenosis. RCA to OM occlusion. Large RV branch with mild luminal irregularities.  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  Recommendations: Medical management.    ECHO Study Date: 06/21/2013 LV EF: 55% - 60% Indications: Chest pain 786.51. History: PMH: Coronary artery disease. Risk factors: Current tobacco use. ------------------------------------------------------------ Study Conclusions Left ventricle: The cavity size was normal. There was mild concentric hypertrophy. Systolic function was normal. The estimated ejection fraction was in the range of 55% to 60%. Although no diagnostic regional wall motion abnormality was identified, this possibility cannot be completely excluded on the basis of this study. ------------------------------------------------------------ Left ventricle: The cavity size was normal. There was mild concentric hypertrophy. Systolic function was normal. The estimated ejection fraction was in the range of 55% to 60%. Although no diagnostic regional wall motion abnormality was identified, this possibility cannot be completely excluded on the basis of this study. ------------------------------------------------------------ Aortic valve: Poorly visualized. Structurally normal valve. Cusp separation was normal. Doppler: Transvalvular velocity was within the normal range. There was no stenosis. No regurgitation. ------------------------------------------------------------ Aorta: The aorta was normal, not dilated, and non-diseased. ------------------------------------------------------------ Mitral valve: Poorly visualized.  Structurally normal valve. Leaflet separation was normal. Doppler: Transvalvular velocity was within the normal range. There was no evidence for stenosis. Trivial regurgitation. ------------------------------------------------------------ Left atrium: The atrium was normal in size. ------------------------------------------------------------ Right ventricle: The cavity size was normal. Wall thickness was normal. Systolic function was normal. ------------------------------------------------------------ Pulmonic valve: Poorly visualized. Structurally normal valve. Cusp separation was normal. Doppler: Transvalvular velocity was within the normal range. No regurgitation. ------------------------------------------------------------ Tricuspid valve: Structurally normal valve. Leaflet separation was normal. Doppler: Transvalvular velocity was within the normal range. No regurgitation. ----------------------------------------------------------- Pulmonary artery: Poorly visualized. The main pulmonary artery was normal-sized. ------------------------------------------------------------ Right atrium: The atrium was normal in size. ------------------------------------------------------------ Pericardium: The pericardium was normal in appearance. There was no pericardial effusion. ------------------------------------------------------------ Systemic veins: Inferior vena cava: The vessel was normal in size; the respirophasic diameter changes were in the normal range (= 50%); findings are consistent with normal central venous pressure. ------------------------------------------------------------ Post procedure conclusions Ascending Aorta: - The aorta was normal, not dilated, and non-diseased.   Discharge Medications     Medication List    STOP taking these medications       metoprolol tartrate 12.5 mg Tabs tablet  Commonly known as:  LOPRESSOR     simvastatin 40 MG tablet  Commonly  known as:  ZOCOR      TAKE these medications       ALPRAZolam 0.25 MG tablet  Commonly known as:  XANAX  Take 1 tablet (0.25 mg total) by mouth 3 (three) times daily as needed for sleep.     aspirin 81 MG tablet  Take 4 tablets (325 mg total) by mouth daily.     atorvastatin 40 MG tablet  Commonly known as:  LIPITOR  Take 1 tablet (40 mg total) by mouth daily at 6 PM.     HYDROcodone-acetaminophen 10-325 MG per tablet  Commonly known as:  NORCO  Take 1-2 tablets by mouth as needed for moderate pain.     isosorbide mononitrate 30 MG 24 hr tablet  Commonly known as:  IMDUR  Take 0.5 tablets (15 mg total) by mouth daily.     lisinopril 5 MG tablet  Commonly known as:  PRINIVIL,ZESTRIL  Take 1 tablet (5 mg total) by mouth daily.     nitroGLYCERIN 0.4 MG SL tablet  Commonly known as:  NITROSTAT  Place 1 tablet (0.4 mg total) under the tongue every 5 (five) minutes as needed for chest pain.     omeprazole 40 MG capsule  Commonly known as:  PRILOSEC  Take 1 capsule (40 mg total) by mouth daily.        Disposition   The patient will be discharged in stable condition to home. Discharge Orders   Future Appointments Provider Department Dept Phone   07/07/2013 4:00 PM Laurey Morale, MD [facility] [place] Office [phone]   Future Orders Complete By Expires   Diet - low sodium heart healthy  As directed    Increase activity slowly  As directed      Follow-up Information   Follow up with [name], MD On 07/07/2013. (@ 4pm)    Specialty:  Cardiology   Contact information:   [address] [place] [address] [phone]         Duration of Discharge Encounter: Greater than 30 minutes including physician and PA time.  SignedVenetia Maxon, KATHRYN PA-C 06/23/2013, 10:03 AM

## 2013-06-23 NOTE — Progress Notes (Signed)
Patient Name: George [name] Date of Encounter: 06/23/2013     Active Problems:   Chest pain   Obesity- Body mass index is 31.44 kg/(m^2).   GERD   Irritable bowel syndrome   HYPERLIPIDEMIA   History of MI   CAD (coronary artery disease)     SUBJECTIVE  Patient had chest pressure last night x 5 hrs that was relieved by Norco and xanax. He has no arm pain or abdominal pain. He was up walking around last night with no SOB. He denies lightheadedness, dizziness or syncope. He admits to some fluttering in his chest.   CURRENT MEDS . aspirin  325 mg Oral Daily  . atorvastatin  40 mg Oral q1800  . enoxaparin (LOVENOX) injection  40 mg Subcutaneous Q24H  . isosorbide mononitrate  15 mg Oral Daily  . lisinopril  2.5 mg Oral Daily  . metoprolol tartrate  12.5 mg Oral BID  . pantoprazole  40 mg Oral Daily  . sodium chloride  3 mL Intravenous Q12H    OBJECTIVE  Filed Vitals:   06/22/13 1815 06/22/13 1845 06/22/13 2011 06/23/13 0512  BP: 121/80 122/72 102/49 99/71  Pulse: 53 52 61 40  Temp:   98.1 F (36.7 C) 97.7 F (36.5 C)  TempSrc:   Oral Oral  Resp:    16  Height:      Weight:      SpO2:   96% 98%    Intake/Output Summary (Last 24 hours) at 06/23/13 0653 Last data filed at 06/22/13 1700  Gross per 24 hour  Intake 778.75 ml  Output    250 ml  Net 528.75 ml   Filed Weights   06/20/13 1927 06/20/13 2241  Weight: 236 lb 14.4 oz (107.457 kg) 231 lb 14.4 oz (105.189 kg)    PHYSICAL EXAM General: NAD  Neck: No JVD, no thyromegaly or thyroid nodule.  Lungs: Clear to auscultation bilaterally with normal respiratory effort.  CV: Nondisplaced PMI. Heart regular S1/S2, no S3/S4, no murmur. No peripheral edema. No carotid bruit. Normal pedal pulses.  Abdomen: Soft, nontender, no hepatosplenomegaly, no distention.  Neurologic: Alert and oriented x 3.  Psych: Normal affect.  Extremities: No clubbing or cyanosis.    Accessory Clinical Findings  CBC  Recent  Labs  06/21/13 0500 06/23/13 0405  WBC 7.2 7.8  HGB 14.1 13.3  HCT 40.2 39.0  MCV 87.4 88.4  PLT 205 195   Basic Metabolic Panel  Recent Labs  06/21/13 0500 06/23/13 0405  NA 139 138  K 4.0 4.4  CL 104 105  CO2 26 26  GLUCOSE 93 90  BUN 13 13  CREATININE 1.05 1.09  CALCIUM 8.8 8.5   Cardiac Enzymes  Recent Labs  06/20/13 2335 06/21/13 0500 06/21/13 1030  TROPONINI <0.30 <0.30 <0.30    Recent Labs  06/21/13 0500  CHOL 201*  HDL 29*  LDLCALC 100*  TRIG 361*  CHOLHDL 6.9   Thyroid Function Tests  Recent Labs  06/20/13 2335  TSH 1.218    TELE Sinus brady with infrequent PVCs. HR in 40s  ECG  06/22/13 HR 40 Marked sinus bradycardia Abnormal ECG  Radiology/Studies  Cardiac Catheterization Procedure Note  Name: THURMOND HILDEBRAN  MRN: [id number]  DOB: 04/20/1966  Procedure: Left Heart Cath, Selective Coronary Angiography, LV angiography  Indication: Unstable angina  Procedural details: The right radial was prepped, draped, and anesthetized with 1% lidocaine. Using modified Seldinger technique, a 5 French sheath was introduced into the  right radial artery. Standard Judkins catheters were used for coronary angiography and left ventriculography. Catheter exchanges were performed over a guidewire. There were no immediate procedural complications. The patient was transferred to the post catheterization recovery area for further monitoring.  Procedural Findings:  Hemodynamics:  AO 94/53  LV 94/2  Coronary angiography:  Coronary dominance: Right  Left mainstem: Normal.  Left anterior descending (LAD): Ostial 30% stenosis. Proximal long 60% stenosis. D1 large and normal.  Left circumflex (LCx): Proximal AV groove long 25% stenosis. MOM large with long proximal 25% stenosis and mild diffuse luminal irregularities. Occluded stent an OM beyond this.  Right coronary artery (RCA): Diffuse luminal irregularities. PDA large with ostial 25% stenosis. PL moderate sized  with mid 40% stenosis. RCA to OM occlusion. Large RV branch with mild luminal irregularities.  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  Recommendations: Medical management.   Study Date: 06/21/2013 LV EF: 55% - 60% Indications: Chest pain 786.51. History: PMH: Coronary artery disease. Risk factors: Current tobacco use. ------------------------------------------------------------ Study Conclusions Left ventricle: The cavity size was normal. There was mild concentric hypertrophy. Systolic function was normal. The estimated ejection fraction was in the range of 55% to 60%. Although no diagnostic regional wall motion abnormality was identified, this possibility cannot be completely excluded on the basis of this study. ------------------------------------------------------------ Left ventricle: The cavity size was normal. There was mild concentric hypertrophy. Systolic function was normal. The estimated ejection fraction was in the range of 55% to 60%. Although no diagnostic regional wall motion abnormality was identified, this possibility cannot be completely excluded on the basis of this study. ------------------------------------------------------------ Aortic valve: Poorly visualized. Structurally normal valve. Cusp separation was normal. Doppler: Transvalvular velocity was within the normal range. There was no stenosis. No regurgitation. ------------------------------------------------------------ Aorta: The aorta was normal, not dilated, and non-diseased. ------------------------------------------------------------ Mitral valve: Poorly visualized. Structurally normal valve. Leaflet separation was normal. Doppler: Transvalvular velocity was within the normal range. There was no evidence for stenosis.  Trivial regurgitation. ------------------------------------------------------------ Left atrium: The atrium was normal in size. ------------------------------------------------------------ Right ventricle: The cavity size was normal. Wall thickness was normal. Systolic function was normal. ------------------------------------------------------------ Pulmonic valve: Poorly visualized. Structurally normal valve. Cusp separation was normal. Doppler: Transvalvular velocity was within the normal range. No regurgitation. ------------------------------------------------------------ Tricuspid valve: Structurally normal valve. Leaflet separation was normal. Doppler: Transvalvular velocity was within the normal range. No regurgitation. ----------------------------------------------------------- Pulmonary artery: Poorly visualized. The main pulmonary artery was normal-sized. ------------------------------------------------------------ Right atrium: The atrium was normal in size. ------------------------------------------------------------ Pericardium: The pericardium was normal in appearance. There was no pericardial effusion. ------------------------------------------------------------ Systemic veins: Inferior vena cava: The vessel was normal in size; the respirophasic diameter changes were in the normal range (= 50%); findings are consistent with normal central venous pressure. ------------------------------------------------------------ Post procedure conclusions Ascending Aorta: - The aorta was normal, not dilated, and non-diseased.    ASSESSMENT AND PLAN 47 yo with history of CAD, smoking, GERD, hypertension and IBS who presented to the ED on 06/20/13 with chest pain.   Chest pain: Cardiac cath yesterday revealed severe single vessel CAD with occluded circumflex stent.  -- Continue medical management with aspirin, statin  -- Low dose BB and Lisinopril held yesterday due to low BP,  BB given last night and BP low again this AM (99/71), pulse 40. Will discontinue BB  -- Continue IMDUR for anginal chest pain.  --  Lisinopril was held for cath yesterday, cr 1.09 today  Smoking: He has quit for 2 months using e-cigarette.   Abdominal discomfort: History of IBS. PPI started yesterday, pain is resolved today.  -- Send home on PPI   Signed, STERN, KATHRYN PA-C  Patient seen with PA, agree with the above note.    PPI has helped with chest discomfort.  LHC yesterday showed chronic occlusion of OM with right to left collaterals.  Echo showed preserved EF.  I think that the symptoms that brought the patient to the hospital may have been GI given improvement with PPI.  HR/BP low so will hold metoprolol.   Patient may go home today.  Followup with me in 2 weeks.  Discharge meds: ASA 81, atorvastatin 40, lisinopril 5 daily, Imdur 15 daily, omeprazole 40 daily.   [name] 06/23/2013 8:25 AM

## 2013-06-23 NOTE — Progress Notes (Signed)
D/c orders received;IV removed with gauze on, pt remains in stable condition, pt meds and instructions reviewed and given to pt, pt d/c to home 

## 2013-06-24 ENCOUNTER — Other Ambulatory Visit: Payer: Self-pay | Admitting: *Deleted

## 2013-06-26 ENCOUNTER — Ambulatory Visit: Payer: Medicaid Other | Admitting: Nurse Practitioner

## 2013-07-06 ENCOUNTER — Telehealth: Payer: Self-pay | Admitting: Cardiology

## 2013-07-07 ENCOUNTER — Encounter: Payer: Self-pay | Admitting: Cardiology

## 2013-07-07 ENCOUNTER — Ambulatory Visit (INDEPENDENT_AMBULATORY_CARE_PROVIDER_SITE_OTHER): Payer: Medicaid Other | Admitting: Cardiology

## 2013-07-07 VITALS — BP 110/62 | HR 54 | Ht 72.0 in | Wt 236.0 lb

## 2013-07-07 DIAGNOSIS — E785 Hyperlipidemia, unspecified: Secondary | ICD-10-CM

## 2013-07-07 DIAGNOSIS — I2510 Atherosclerotic heart disease of native coronary artery without angina pectoris: Secondary | ICD-10-CM

## 2013-07-07 DIAGNOSIS — F17200 Nicotine dependence, unspecified, uncomplicated: Secondary | ICD-10-CM

## 2013-07-07 NOTE — Progress Notes (Signed)
Patient ID: George [name], male   DOB: 1965/09/06, 47 y.o.   MRN: [id number] PCP: Dr. Efrain Sella [name] is a 47 yo male with a h/o hyperlipidemia and sleep apnea who presented to [facility] 07/07/10 with chest pain. He ruled in for NSTEMI. Cath demonstrated a 99% CFX lesion that was treated with a DES. Cath also demonstrated pLAD 60% stenosis. He has multilevel DDD and DJD with chronic severe back pain.  He is trying to get disability.   Patient has had episodes of atypical chest pain from time to time.  He had a Lexiscan Cardiolite in 2/14 that was normal.  He was admitted in 12/14 with some atypical chest pain.  He ruled out for MI.  Given recent Cardiolite, it was decided to take him for catheterization to definitively assess his coronary anatomy.  This showed an occluded OM at the site of prior stent with collaterals from the RCA (not an acute event).  Echo showed normal EF.  Patient was managed medically and discharged home.  Main complaint continues to be back pain.  This significantly limits his movement.  He has had no further chest pain.  No lightheadedness.  While he was in the hospital, metoprolol was stopped due to bradycardia.  He is not very active but denies significant exertional edema.    ECG: NSR, normal  Labs (3/13): LDL 115, TGs 170, HDL 32, K 4.2, creatinine 1.1 Labs (12/14): K 4.4, creatinine 1.09, troponin negative x 3, LDL 100, HDL 29  Allergies (verified):  1) ! * Anaprox  2) ! * Tetanus   Past Medical History:  1. CAD: s/p NSTEMI 06/2010 tx with DES to CFX.  LHC with pLAD 60%, pCFX 30%, m99% (PCI) then 60%, PLV 60% (small); EF 55%.  Echo 06/2010: EF 60-65%; mod LVH.  Lexiscan Cardiolite in 2/14 with no ischemia or infarction.  LHC (12/14) with 60% pLAD, total occlusion of OM at site of prior stent with right to left collaterals.  Echo (12/14) with EF 55-60%, no wall motion abnormalities.  2. Prior smoker 3. GERD (ICD-530.81)  4. IRRITABLE BOWEL SYNDROME (ICD-564.1)  5.  DEGENERATIVE JOINT DISEASE (ICD-715.90)  6. BACK PAIN, LUMBAR (ICD-724.2): severe pain 7. ANXIETY (ICD-300.00)   Review of Systems  As per the HPI. All other systems reviewed and negative.   FH: Father with CAD  SH: quit smoking in 10/14, smoked heavily before.  Unemployed.   Current Outpatient Prescriptions  Medication Sig Dispense Refill  . ALPRAZolam (XANAX) 0.25 MG tablet Take 1 tablet (0.25 mg total) by mouth 3 (three) times daily as needed for sleep.  30 tablet  0  . aspirin 81 MG tablet Take 81 mg by mouth daily.      Marland Kitchen atorvastatin (LIPITOR) 40 MG tablet Take 1 tablet (40 mg total) by mouth daily at 6 PM.  30 tablet  6  . HYDROcodone-acetaminophen (NORCO) 10-325 MG per tablet Take 1-2 tablets by mouth as needed for moderate pain.       . isosorbide mononitrate (IMDUR) 30 MG 24 hr tablet Take 0.5 tablets (15 mg total) by mouth daily.  30 tablet  3  . lisinopril (PRINIVIL,ZESTRIL) 5 MG tablet Take 1 tablet (5 mg total) by mouth daily.  90 tablet  3  . nitroGLYCERIN (NITROSTAT) 0.4 MG SL tablet Place 1 tablet (0.4 mg total) under the tongue every 5 (five) minutes as needed for chest pain.  30 tablet  3  . omeprazole (PRILOSEC) 40 MG capsule Take  1 capsule (40 mg total) by mouth daily.  30 capsule  6   No current facility-administered medications for this visit.    BP 110/62  Pulse 54  Ht 6' (1.829 m)  Wt 107.049 kg (236 lb)  BMI 32.00 kg/m2 General: NAD Neck: No JVD, no thyromegaly or thyroid nodule.  Lungs: Clear to auscultation bilaterally with normal respiratory effort. CV: Nondisplaced PMI.  Heart regular S1/S2, no S3/S4, no murmur.  No peripheral edema.  No carotid bruit.  Normal pedal pulses.  Abdomen: Soft, nontender, no hepatosplenomegaly, no distention.  Skin: Intact without lesions or rashes.  Neurologic: Alert and oriented x 3.  Psych: Normal affect. Extremities: No clubbing or cyanosis.   Assessment/Plan: 1. CAD: I suspect that the patient's recent chest pain  was noncardiac.  However, at some point in the last year or so he had occlusion of his OM stent.  He has good collaterals to the OM from the RCA.   - He will not be able to do cardiac rehab due to back pain.  - Continue ASA (can decrease to 81 mg daily), atorvastatin, lisinopril - He can continue Imdur for now but may be able to stop this in the future.  - No beta blocker with bradycardia.  2. Hyperlipidemia: Statin recently intensified.  Repeat lipids/LFTs in 2 months.  3. I encouraged patient to stay off cigarettes.   George [name] 07/07/2013

## 2013-07-07 NOTE — Patient Instructions (Signed)
Your physician has recommended you make the following change in your medication:  1) Reduce Aspirin to 81mg  daily  Your physician recommends that you return for a FASTING lipid profile and alt in 2 months  Your physician recommends that you schedule a follow-up appointment in: 3 months

## 2013-08-24 ENCOUNTER — Other Ambulatory Visit: Payer: Medicaid Other

## 2013-09-21 ENCOUNTER — Ambulatory Visit: Payer: Medicaid Other | Admitting: Cardiology

## 2013-09-29 ENCOUNTER — Emergency Department (HOSPITAL_COMMUNITY): Payer: Medicaid Other

## 2013-09-29 ENCOUNTER — Observation Stay (HOSPITAL_COMMUNITY)
Admission: EM | Admit: 2013-09-29 | Discharge: 2013-09-30 | Disposition: A | Discharge status: Home / Self Care | Payer: Medicaid Other | Attending: Family Medicine | Admitting: Family Medicine

## 2013-09-29 ENCOUNTER — Encounter (HOSPITAL_COMMUNITY): Payer: Self-pay | Admitting: Emergency Medicine

## 2013-09-29 DIAGNOSIS — Z9861 Coronary angioplasty status: Secondary | ICD-10-CM | POA: Insufficient documentation

## 2013-09-29 DIAGNOSIS — Z79899 Other long term (current) drug therapy: Secondary | ICD-10-CM | POA: Insufficient documentation

## 2013-09-29 DIAGNOSIS — E669 Obesity, unspecified: Secondary | ICD-10-CM | POA: Insufficient documentation

## 2013-09-29 DIAGNOSIS — K219 Gastro-esophageal reflux disease without esophagitis: Secondary | ICD-10-CM

## 2013-09-29 DIAGNOSIS — M1990 Unspecified osteoarthritis, unspecified site: Secondary | ICD-10-CM | POA: Insufficient documentation

## 2013-09-29 DIAGNOSIS — M545 Low back pain, unspecified: Secondary | ICD-10-CM

## 2013-09-29 DIAGNOSIS — F411 Generalized anxiety disorder: Secondary | ICD-10-CM

## 2013-09-29 DIAGNOSIS — Z887 Allergy status to serum and vaccine status: Secondary | ICD-10-CM | POA: Insufficient documentation

## 2013-09-29 DIAGNOSIS — I252 Old myocardial infarction: Secondary | ICD-10-CM

## 2013-09-29 DIAGNOSIS — Z888 Allergy status to other drugs, medicaments and biological substances status: Secondary | ICD-10-CM | POA: Insufficient documentation

## 2013-09-29 DIAGNOSIS — Z87828 Personal history of other (healed) physical injury and trauma: Secondary | ICD-10-CM | POA: Insufficient documentation

## 2013-09-29 DIAGNOSIS — Z91018 Allergy to other foods: Secondary | ICD-10-CM | POA: Insufficient documentation

## 2013-09-29 DIAGNOSIS — R079 Chest pain, unspecified: Secondary | ICD-10-CM

## 2013-09-29 DIAGNOSIS — Z7982 Long term (current) use of aspirin: Secondary | ICD-10-CM | POA: Insufficient documentation

## 2013-09-29 DIAGNOSIS — Z87891 Personal history of nicotine dependence: Secondary | ICD-10-CM

## 2013-09-29 DIAGNOSIS — I2510 Atherosclerotic heart disease of native coronary artery without angina pectoris: Secondary | ICD-10-CM

## 2013-09-29 DIAGNOSIS — E785 Hyperlipidemia, unspecified: Secondary | ICD-10-CM | POA: Insufficient documentation

## 2013-09-29 DIAGNOSIS — R0789 Other chest pain: Principal | ICD-10-CM | POA: Insufficient documentation

## 2013-09-29 DIAGNOSIS — M7989 Other specified soft tissue disorders: Secondary | ICD-10-CM | POA: Insufficient documentation

## 2013-09-29 DIAGNOSIS — Z9889 Other specified postprocedural states: Secondary | ICD-10-CM | POA: Insufficient documentation

## 2013-09-29 LAB — CBC
HEMATOCRIT: 40 % (ref 39.0–52.0)
HEMOGLOBIN: 14 g/dL (ref 13.0–17.0)
MCH: 30.9 pg (ref 26.0–34.0)
MCHC: 35 g/dL (ref 30.0–36.0)
MCV: 88.3 fL (ref 78.0–100.0)
Platelets: 195 10*3/uL (ref 150–400)
RBC: 4.53 MIL/uL (ref 4.22–5.81)
RDW: 12.4 % (ref 11.5–15.5)
WBC: 7.8 10*3/uL (ref 4.0–10.5)

## 2013-09-29 LAB — BASIC METABOLIC PANEL
BUN: 13 mg/dL (ref 6–23)
CO2: 24 meq/L (ref 19–32)
Calcium: 9.4 mg/dL (ref 8.4–10.5)
Chloride: 102 mEq/L (ref 96–112)
Creatinine, Ser: 1.12 mg/dL (ref 0.50–1.35)
GFR calc Af Amer: 87 mL/min — ABNORMAL LOW (ref >90)
GFR calc non Af Amer: 75 mL/min — ABNORMAL LOW (ref >90)
Glucose, Bld: 87 mg/dL (ref 70–99)
Potassium: 4.3 mEq/L (ref 3.7–5.3)
Sodium: 140 mEq/L (ref 137–147)

## 2013-09-29 LAB — I-STAT TROPONIN, ED: Troponin i, poc: 0.01 ng/mL (ref 0.00–0.08)

## 2013-09-29 MED ORDER — ONDANSETRON HCL 4 MG/2ML IJ SOLN
4.0000 mg | Freq: Once | INTRAMUSCULAR | Status: AC
  Administered 2013-09-30: 4 mg via INTRAVENOUS
  Filled 2013-09-29: qty 2

## 2013-09-29 MED ORDER — SODIUM CHLORIDE 0.9 % IV BOLUS (SEPSIS)
1000.0000 mL | Freq: Once | INTRAVENOUS | Status: AC
  Administered 2013-09-29: 1000 mL via INTRAVENOUS

## 2013-09-29 MED ORDER — MORPHINE SULFATE 4 MG/ML IJ SOLN
4.0000 mg | Freq: Once | INTRAMUSCULAR | Status: AC
  Administered 2013-09-30: 4 mg via INTRAVENOUS
  Filled 2013-09-29: qty 1
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
## 2013-09-29 NOTE — ED Notes (Addendum)
Pt states hx of bradycardia

## 2013-09-29 NOTE — ED Notes (Signed)
Pt states that he has been having CP for a long time, and that he has been told he has angina, but tonight he is having tingling in his hand and fingers and muscle spasms

## 2013-09-30 DIAGNOSIS — M545 Low back pain, unspecified: Secondary | ICD-10-CM

## 2013-09-30 DIAGNOSIS — F411 Generalized anxiety disorder: Secondary | ICD-10-CM

## 2013-09-30 DIAGNOSIS — K219 Gastro-esophageal reflux disease without esophagitis: Secondary | ICD-10-CM

## 2013-09-30 DIAGNOSIS — Z87891 Personal history of nicotine dependence: Secondary | ICD-10-CM

## 2013-09-30 DIAGNOSIS — I252 Old myocardial infarction: Secondary | ICD-10-CM

## 2013-09-30 DIAGNOSIS — I2510 Atherosclerotic heart disease of native coronary artery without angina pectoris: Secondary | ICD-10-CM

## 2013-09-30 DIAGNOSIS — R079 Chest pain, unspecified: Secondary | ICD-10-CM

## 2013-09-30 LAB — LIPID PANEL
CHOL/HDL RATIO: 4 ratio
Cholesterol: 131 mg/dL (ref 0–200)
HDL: 33 mg/dL — AB (ref >39)
LDL Cholesterol: 63 mg/dL (ref 0–99)
Triglycerides: 174 mg/dL — ABNORMAL HIGH (ref <150)
VLDL: 35 mg/dL (ref 0–40)

## 2013-09-30 LAB — HEMOGLOBIN A1C
Hgb A1c MFr Bld: 5.6 % (ref <5.7)
Mean Plasma Glucose: 114 mg/dL (ref <117)

## 2013-09-30 LAB — D-DIMER, QUANTITATIVE (NOT AT ARMC)

## 2013-09-30 LAB — TROPONIN I
Troponin I: <0.3 ng/mL (ref <0.30)
Troponin I: <0.3 ng/mL (ref <0.30)

## 2013-09-30 LAB — PRO B NATRIURETIC PEPTIDE: PRO B NATRI PEPTIDE: 76 pg/mL (ref 0–125)

## 2013-09-30 MED ORDER — NITROGLYCERIN 0.4 MG SL SUBL: 0.4000 mg | SUBLINGUAL_TABLET | SUBLINGUAL | Status: DC | PRN

## 2013-09-30 MED ORDER — ATORVASTATIN CALCIUM 40 MG PO TABS: 20.0000 mg | ORAL_TABLET | Freq: Every day | ORAL | Status: DC

## 2013-09-30 MED ORDER — ISOSORBIDE MONONITRATE ER 30 MG PO TB24
30.0000 mg | ORAL_TABLET | Freq: Every day | ORAL | Status: DC
  Administered 2013-09-30: 30 mg via ORAL
  Filled 2013-09-30: qty 1

## 2013-09-30 MED ORDER — LISINOPRIL 5 MG PO TABS
5.0000 mg | ORAL_TABLET | Freq: Every day | ORAL | Status: DC
  Administered 2013-09-30: 5 mg via ORAL
  Filled 2013-09-30: qty 1

## 2013-09-30 MED ORDER — PANTOPRAZOLE SODIUM 40 MG PO TBEC
80.0000 mg | DELAYED_RELEASE_TABLET | Freq: Every day | ORAL | Status: DC
  Administered 2013-09-30: 80 mg via ORAL
  Filled 2013-09-30: qty 2

## 2013-09-30 MED ORDER — ASPIRIN 81 MG PO CHEW: 81.0000 mg | CHEWABLE_TABLET | Freq: Every day | ORAL | Status: DC

## 2013-09-30 MED ORDER — MORPHINE SULFATE 2 MG/ML IJ SOLN
4.0000 mg | Freq: Once | INTRAMUSCULAR | Status: DC
Start: 2013-09-30 — End: 2013-09-30

## 2013-09-30 MED ORDER — ASPIRIN 81 MG PO CHEW
81.0000 mg | CHEWABLE_TABLET | Freq: Every day | ORAL | Status: DC
  Administered 2013-09-30: 81 mg via ORAL
  Filled 2013-09-30: qty 1

## 2013-09-30 MED ORDER — ROSUVASTATIN CALCIUM 20 MG PO TABS: 20.0000 mg | ORAL_TABLET | Freq: Every day | ORAL | Status: DC

## 2013-09-30 MED ORDER — HEPARIN SODIUM (PORCINE) 5000 UNIT/ML IJ SOLN
5000.0000 [IU] | Freq: Three times a day (TID) | INTRAMUSCULAR | Status: DC
  Filled 2013-09-30 (×3): qty 1

## 2013-09-30 MED ORDER — ALPRAZOLAM 0.25 MG PO TABS
0.2500 mg | ORAL_TABLET | Freq: Three times a day (TID) | ORAL | Status: DC | PRN
  Administered 2013-09-30 (×2): 0.25 mg via ORAL
  Filled 2013-09-30 (×2): qty 1

## 2013-09-30 MED ORDER — SODIUM CHLORIDE 0.9 % IV BOLUS (SEPSIS): 1000.0000 mL | Freq: Once | INTRAVENOUS | Status: DC

## 2013-09-30 MED ORDER — ASPIRIN 325 MG PO TABS
325.0000 mg | ORAL_TABLET | Freq: Every day | ORAL | Status: DC
  Filled 2013-09-30: qty 1

## 2013-09-30 MED ORDER — ONDANSETRON HCL 4 MG/2ML IJ SOLN: 4.0000 mg | Freq: Once | INTRAMUSCULAR | Status: DC

## 2013-09-30 NOTE — ED Notes (Signed)
PA at bedside.

## 2013-09-30 NOTE — Progress Notes (Signed)
PCP Progress Note   I came by to see [name] during his hospitalization. He was sleeping soundly when I entered. He reports being pain free. His biggest concerns: 1. Frustration with his occluded stent.  2. Anxiety related to his wife's health problems and mental illness. His mother also had a stroke in 05/2013.   He reports that his pain which he describes as cramping is in line with change from simvastatin to atorvastatin. Pain is R chest, radiates down to abdomen, then spreads diffusely.   BP 145/75  Pulse 48  Temp(Src) 97.5 F (36.4 [name]  Resp 18  Ht 6' (1.829 m)  Wt 244 lb 1.6 oz (110.723 kg)  BMI 33.10 kg/m2  SpO2 95% General appearance: alert, cooperative and no distress Lungs: clear to auscultation bilaterally Chest wall: no tenderness Heart: regular rate and rhythm, S1, S2 normal, no murmur, click, rub or gallop Grip strength: 5/5 b/l UE strength.  A/P: 48 yo M with CAD s/p stenting and stent occlusion being medically managed presenting with chest cramps. ACS ruled out. Agree that high intensity statin could be source of symptoms. Recommend team touch bases with patient's cardiologist with plan to change from lipitor 40 mg daily to crestor or lower dose of lipitor.   Patient advised to f/u in the office with me to discuss his anxiety and smoking cessation.   [name], George [name] 2:07 PM 09/30/2013

## 2013-09-30 NOTE — Progress Notes (Signed)
DC orders received.  Patient stable with no S/S of distress.  Medication and discharge information reviewed with patient.  Patient DC home. George [name]  

## 2013-09-30 NOTE — H&P (Signed)
[facility]MT[facility] Attending Admission Note: Alphonzo Devera MD [phone] pager office [phone] I  have seen and examined this patient, reviewed their chart. I have discussed this patient with the resident. I agree with the resident's findings, assessment and care plan. 

## 2013-09-30 NOTE — Discharge Instructions (Signed)
Mr. Lenoria Farrierkers, you were admitted for chest pain. We ruled out any heart cause of your pain.  We will also be changing your medications since they be having an effect on your muscle spasms.   [facility]    Chest Pain  It is often hard to give a specific diagnosis for the cause of chest pain. Among other possibilities your symptoms might be caused by inadequate oxygen delivery to your heart (angina). Angina that is not treated or evaluated can lead to a heart attack (myocardial infarction) or death. Blood tests, electrocardiograms, and X-rays may have been done to help determine a possible cause of your chest pain. After evaluation and observation, your health care provider has determined that it is unlikely your pain was caused by an unstable condition that requires hospitalization. However, a full evaluation of your pain may need to be completed, with additional diagnostic testing as directed. It is very important to keep your follow-up appointments. Not keeping your follow-up appointments could result in permanent heart damage, disability, or death. If there is any problem keeping your follow-up appointments, you must call your health care provider. HOME CARE INSTRUCTIONS  Due to the slight chance that your pain could be angina, it is important to follow your health care provider's treatment plan and also maintain a healthy lifestyle:  Maintain or work toward achieving a healthy weight.  Stay physically active and exercise regularly.  Decrease your salt intake.  Eat a balanced, healthy diet. Talk to a dietician to learn about heart healthy foods.  Increase your fiber intake by including whole grains, vegetables, fruits, and nuts in your diet.  Avoid situations that cause stress, anger, or depression.  Take medicines as advised by your health care provider. Report any side effects to your health care provider. Do not stop medicines or adjust the  dosages on your own.  Quit smoking. Do not use nicotine patches or gum until you check with your health care provider.  Keep your blood pressure, blood sugar, and cholesterol levels within normal limits.  Limit alcohol intake to no more than 1 drink per day for women that are not pregnant and 2 drinks per day for men.  Do not abuse drugs. SEEK IMMEDIATE MEDICAL CARE IF: You have severe chest pain or pressure which may include symptoms such as:  You feel pain or pressure in you arms, neck, jaw, or back.  You have severe back or abdominal pain, feel sick to your stomach (nauseous), or throw up (vomit).  You are sweating profusely.  You are having a fast or irregular heartbeat.  You feel short of breath while at rest.  You notice increasing shortness of breath during rest, sleep, or with activity.  You have chest pain that does not get better after rest or after taking your usual medicine.  You wake from sleep with chest pain.  You are unable to sleep because you cannot breathe.  You develop a frequent cough or you are coughing up blood.  You feel dizzy, faint, or experience extreme fatigue.  You develop severe weakness, dizziness, fainting, or chills. Any of these symptoms may represent a serious problem that is an emergency. Do not wait to see if the symptoms will go away. Call your local emergency services (911 in the [place] Do not drive yourself to the hospital. MAKE SURE YOU:  Understand these instructions.  Will watch your condition.  Will get help right away if you are  not doing well or get worse. Document Released: 07/28/2010 Document Revised: 02/25/2013 Document Reviewed: 12/25/2012 [facility] Patient Information 2014 [place], [place].  Aspirin and Your Heart Aspirin affects the way your blood clots and helps "thin" the blood. Aspirin has many uses in heart disease. It may be used as a primary prevention to help reduce the risk of heart related events. It also can be  used as a secondary measure to prevent more heart attacks or to prevent additional damage from blood clots.  ASPIRIN MAY HELP IF YOU:  Have had a heart attack or chest pain.  Have undergone open heart surgery such as CABG (Coronary Artery Bypass Surgery).  Have had coronary angioplasty with or without stents.  Have experienced a stroke or TIA (transient ischemic attack).  Have peripheral vascular disease (PAD).  Have chronic heart rhythm problems such as atrial fibrillation.  Are at risk for heart disease. BEFORE STARTING ASPIRIN Before you start taking aspirin, your caregiver will need to review your medical history. Many things will need to be taken into consideration, such as:  Smoking status.  Blood pressure.  Diabetes.  Gender.  Weight.  Cholesterol level. ASPIRIN DOSES  Aspirin should only be taken on the advice of your caregiver. Talk to your caregiver about how much aspirin you should take. Aspirin comes in different doses such as:  81 mg.  162 mg.  325 mg.  The aspirin dose you take may be affected by many factors, some of which include:  Your current medications, especially if your are taking blood-thinners or anti-platelet medicine.  Liver function.  Heart disease risk.  Age.  Aspirin comes in two forms:  Non-enteric-coated. This type of aspirin does not have a coating and is absorbed faster. Non-enteric coated aspirin is recommended for patients experiencing chest pain symptoms. This type of aspirin also comes in a chewable form.  Enteric-coated. This means the aspirin has a special coating that releases the medicine very slowly. Enteric-coated aspirin causes less stomach upset. This type of aspirin should not be chewed or crushed. ASPIRIN SIDE EFFECTS Daily use of aspirin can increase your risk of serious side effects. Some of these include:  Increased bleeding. This can range from a cut that does not stop bleeding to more serious problems such  as stomach bleeding or bleeding into the brain (Intracerebral bleeding).  Increased bruising.  Stomach upset.  An allergic reaction such as red, itchy skin.  Increased risk of bleeding when combined with non-steroidal anti-inflammatory medicine (NSAIDS).  Alcohol should be drank in moderation when taking aspirin. Alcohol can increase the risk of stomach bleeding when taken with aspirin.  Aspirin should not be given to children less than 72 years of age due to the association of Reye syndrome. Reye syndrome is a serious illness that can affect the brain and liver. Studies have linked Reye syndrome with aspirin use in children.  People that have nasal polyps have an increased risk of developing an aspirin allergy. SEEK MEDICAL CARE IF:   You develop an allergic reaction such as:  Hives.  Itchy skin.  Swelling of the lips, tongue or face.  You develop stomach pain.  You have unusual bleeding or bruising.  You have ringing in your ears. SEEK IMMEDIATE MEDICAL CARE IF:   You have severe chest pain, especially if the pain is crushing or pressure-like and spreads to the arms, back, neck, or jaw. THIS IS AN EMERGENCY. Do not wait to see if the pain will go away. Get medical help  at once. Call your local emergency services (911 in the [place] DO NOT drive yourself to the hospital.  You have stroke-like symptoms such as:  Loss of vision.  Difficulty talking.  Numbness or weakness on one side of your body.  Numbness or weakness in your arm or leg.  Not thinking clearly or feeling confused.  Your bowel movements are bloody, dark red or black in color.  You vomit or cough up blood.  You have blood in your urine.  You have shortness of breath, coughing or wheezing. MAKE SURE YOU:   Understand these instructions.  Will monitor your condition.  Seek immediate medical care if necessary. Document Released: 06/07/2008 Document Revised: 10/20/2012 Document Reviewed:  06/07/2008 [facility] Patient Information 2014 [place], [place].

## 2013-09-30 NOTE — Discharge Summary (Signed)
Family Medicine Teaching [facility] Discharge Summary  Patient name: George [name] Medical record number: [id number] Date of birth: 02-Apr-1966 Age: 48 y.o. Gender: male Date of Admission: 09/29/2013  Date of Discharge: 09/30/13 Admitting Physician: Nestor Ramp, MD  Primary Care Provider: Lora Paula, MD Consultants: none  Indication for Hospitalization: chest pain   Discharge Diagnoses/Problem List:  Patient Active Problem List   Diagnosis Date Noted  . Obesity   . History of tobacco abuse   . Chest pain 06/20/2013  . De Quervain's tenosynovitis, left 05/04/2012  . Laceration of right thumb with complication 02/12/2012  . CAD (coronary artery disease) 02/08/2012  . HYPERLIPIDEMIA 07/27/2010  . History of MI (myocardial infarction) 07/27/2010  . CORONARY ATHEROSCLEROSIS NATIVE CORONARY ARTERY 07/27/2010  . CIGARETTE SMOKER 07/25/2008  . DEGENERATIVE JOINT DISEASE 07/25/2008  . SLEEP DISORDER 07/25/2008  . ANXIETY 06/23/2007  . GERD 06/23/2007  . Irritable bowel syndrome 06/23/2007  . BACK PAIN, LUMBAR 06/23/2007   Disposition: home   Discharge Condition: improved   Discharge Exam:  General: NAD, cooperative with exam, caucasian male  HEENT: [place]/AT, EOMI, MMM  Cardiovascular: S1S2, bradycardic though regular rhythm, no murmurs, rubs or gallops  Respiratory: CTAB, no wheezing, crackles or rhonchi  Abdomen: soft, NTND, +BS, no HSM  MSK: no tenderness to palpation of chest wall, active full ROM of b/l shoulders.  Extremities: moves all extremities freely, DP and PT +2 b/l  Skin: no rashes  Neuro: no gross deficits, 5/5 strength in upper and lower extremities b/l, sensation to light touch intact throughout   Brief Hospital Course:  George [name] is a 47 y.o. male presenting with chest pain s/p normal Cardiolite in 12/14. 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   #chest pain/muscle spasms: Patient was complaining of centralized chest pain.  BNP within normal range. Patient's chest pain seemed well controlled. He denies any pain with palpation so doubt MSK. He denies any pleuritic type pain and D-dimer negative. He is taking a statin but only recently has the spasms become worse. EKG showed NSR and istat troponin's were normal. Patient with history of CAD s/p stenting in 2012 with recent cath that revealed occlusion of this stent with collaterals.  A repeat EKG was normal. His pain had resolved.  There may be more of a anxiety component to his manifestations than a visceral cause. He was stable with no pain prior to discharge.   # Sinus bradycardia: He had bradycardia noted during his recent hospital stay in December 2014.  It was noted on EKG. No apparent heart block on EKG. Will need a cardiology follow up as outpatient.   #HTN: Stable and continued on his lisinopril 5 mg.  No beta blocker due to his ongoing bradycardia as above.    #HLD: His statin was intensified in December 2014. His muscle spasms only recently began. Due to this, we decreased his dose of statin to atorvastatin 20 mg daily.   #Lumbago: Chronic back pain from DDD. Reported to be having a spinal fusion. Takes norco TID as needed. Currently no plans for operation since he cannot find an orthopedist to take his case.   #Anxiety: Reports to having several stressors in his life. He is trying for disability currently and not working. Previously taking xanax but current prescription has run out. Has not seen  his PCP since 2013. Follow up with be needed in order to stabilize his anxiety with a more appropriate medication.    #GERD: stable and no complaints of reflux.   #tobacco abuse: He is currently using e-cigarettes. Roughly 70 pack years history prior to quitting in October 2014.  Advised by his cardiologist to quit using e-cigarettes.    Issues for Follow Up:  1. Ongoing muscle spasms. Decreased his lipitor to 20 mg with the thought that his spasms may be an intolerance.  He reports a history of being heavily sedated with prior use of flexeril.  He says he has had PT before on his back and it made it worse.  2. He has recently ran out of his xanax prior to admission. Believe that there is a large component of anxiety driving his symptoms (spasms and chest pain). May be beneficial to starting anxiolytic medication and stopping the xanax.  3. Has not followed up with his PCP since 2013 due to "weather." Many of his problems would benefit from regular f/u.  4. Discussed with Dr. Shirlee Latch before discharge, recommended patient follow up with him in next 2-4 weeks.  Significant Procedures: none  Significant Labs and Imaging:   Recent Labs Lab 09/29/13 2001  WBC 7.8  HGB 14.0  HCT 40.0  PLT 195    Recent Labs Lab 09/29/13 2001  NA 140  K 4.3  CL 102  CO2 24  GLUCOSE 87  BUN 13  CREATININE 1.12  CALCIUM 9.4   Lipid Panel     Component Value Date/Time   CHOL 131 09/30/2013 0300   TRIG 174* 09/30/2013 0300   HDL 33* 09/30/2013 0300   CHOLHDL 4.0 09/30/2013 0300   VLDL 35 09/30/2013 0300   LDLCALC 63 09/30/2013 0300     Recent Labs Lab 09/29/13 2320  PROBNP 76.0       Recent Labs Lab 09/30/13 0300 09/30/13 0755  TROPONINI <0.30 <0.30   istat trop: (-)  D-dimer: <0.27  CXR: IMPRESSION: No active cardiopulmonary disease   Results/Tests Pending at Time of Discharge: none  Discharge Medications:    Medication List    STOP taking these medications       aspirin 325 MG tablet  Replaced by:  aspirin 81 MG chewable tablet     atorvastatin 40 MG tablet  Commonly known as:  LIPITOR      TAKE these medications       ALPRAZolam 0.25 MG tablet  Commonly known as:  XANAX  Take 0.25 mg by mouth 3 (three) times daily as needed for anxiety.     aspirin 81 MG chewable tablet  Chew 1 tablet (81 mg  total) by mouth daily.     HYDROcodone-acetaminophen 10-325 MG per tablet  Commonly known as:  NORCO  Take 1-2 tablets by mouth every 6 (six) hours as needed for moderate pain.     isosorbide mononitrate 30 MG 24 hr tablet  Commonly known as:  IMDUR  Take 30 mg by mouth daily.     lisinopril 5 MG tablet  Commonly known as:  PRINIVIL,ZESTRIL  Take 5 mg by mouth daily.     nitroGLYCERIN 0.4 MG SL tablet  Commonly known as:  NITROSTAT  Place 0.4 mg under the tongue every 5 (five) minutes as needed for chest pain.     omeprazole 40 MG capsule  Commonly known as:  PRILOSEC  Take 40 mg by mouth daily.     rosuvastatin 20 MG tablet  Commonly known as:  CRESTOR  Take 1 tablet (20 mg total) by mouth daily.        Discharge Instructions: Please refer to Patient Instructions section of EMR for full details.  Patient was counseled important signs and symptoms that should prompt return to medical care, changes in medications, dietary instructions, activity restrictions, and follow up appointments.   Follow-Up Appointments: Follow-up Information   Follow up with Beverely LowAdamo, Elena, MD On 10/06/2013. (4:00)    Specialty:  Family Medicine   Contact information:   [address] [place] [id number] [phone]       Myra RudeJeremy E Shaterica Mcclatchy, MD 10/03/2013, 2:43 PM PGY-1, [facility]
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
## 2013-10-01 ENCOUNTER — Telehealth: Payer: Self-pay | Admitting: *Deleted

## 2013-10-01 NOTE — ED Provider Notes (Signed)
Medical screening examination/treatment/procedure(s) were performed by non-physician practitioner and as supervising physician I was immediately available for consultation/collaboration.  EKG Interpretation  None   Not in muse  Date: 09/30/2013  Rate: 64  Rhythm: normal sinus rhythm  QRS Axis: normal  Intervals: normal  ST/T Wave abnormalities: normal  Conduction Disutrbances:none  No acute findings, reviewed.   Enid SkeensJoshua M Artrell Lawless, MD 10/01/13 1228

## 2013-10-01 NOTE — Telephone Encounter (Signed)
Received PA approval for Crestor 20 mg tab via [employer].  Med approved for 10/01/13 - 10/01/14.  [facility] pharmacy informed.  PA confirmation number [id number] Link SnufferW.  Kadynce Bonds, Bronson Ingamika L, RN

## 2013-10-01 NOTE — Telephone Encounter (Signed)
Form filled out and returned to Tamika.

## 2013-10-01 NOTE — Telephone Encounter (Signed)
[facility]restor 20 mg tab. Formulary and PA form placed in provider box for completion. Clovis PuMartin, Ellawyn Wogan L, RN

## 2013-10-05 NOTE — Discharge Summary (Signed)
Family Medicine Teaching Service  Discharge Note : Attending Atreyu Mak MD Pager [phone] Office [phone] I have seen and examined this patient, reviewed their chart and discussed discharge planning wit the resident at the time of discharge. I agree with the discharge plan as above.  

## 2013-10-06 ENCOUNTER — Ambulatory Visit (INDEPENDENT_AMBULATORY_CARE_PROVIDER_SITE_OTHER): Payer: Medicaid Other | Admitting: Family Medicine

## 2013-10-06 ENCOUNTER — Encounter: Payer: Self-pay | Admitting: Family Medicine

## 2013-10-06 VITALS — BP 114/78 | HR 71 | Temp 99.0°F | Ht 72.0 in | Wt 240.0 lb

## 2013-10-06 DIAGNOSIS — H9202 Otalgia, left ear: Secondary | ICD-10-CM | POA: Insufficient documentation

## 2013-10-06 DIAGNOSIS — H9209 Otalgia, unspecified ear: Secondary | ICD-10-CM

## 2013-10-06 DIAGNOSIS — M545 Low back pain, unspecified: Secondary | ICD-10-CM

## 2013-10-06 DIAGNOSIS — F411 Generalized anxiety disorder: Secondary | ICD-10-CM

## 2013-10-06 MED ORDER — ALPRAZOLAM 0.5 MG PO TABS: 0.5000 mg | ORAL_TABLET | Freq: Three times a day (TID) | ORAL | Status: DC | PRN

## 2013-10-06 MED ORDER — AMOXICILLIN-POT CLAVULANATE 875-125 MG PO TABS: 1.0000 | ORAL_TABLET | Freq: Two times a day (BID) | ORAL | Status: DC

## 2013-10-06 MED ORDER — IPRATROPIUM BROMIDE 0.03 % NA SOLN: 2.0000 | Freq: Two times a day (BID) | NASAL | Status: DC | PRN

## 2013-10-06 MED ORDER — SERTRALINE HCL 25 MG PO TABS: ORAL_TABLET | ORAL | Status: DC

## 2013-10-06 NOTE — Patient Instructions (Signed)
Barotitis Media  Barotitis media is inflammation of your middle ear. This occurs when the auditory tube (eustachian tube) leading from the back of your nose (nasopharynx) to your eardrum is blocked. This blockage may result from a cold, environmental allergies, or an upper respiratory infection. Unresolved barotitis media may lead to damage or hearing loss (barotrauma), which may become permanent.  HOME CARE INSTRUCTIONS   · Use medicines as recommended by your health care provider. Over-the-counter medicines will help unblock the canal and can help during times of air travel.  · Do not put anything into your ears to clean or unplug them. Eardrops will not be helpful.  · Do not swim, dive, or fly until your health care provider says it is all right to do so. If these activities are necessary, chewing gum with frequent, forceful swallowing may help. It is also helpful to hold your nose and gently blow to pop your ears for equalizing pressure changes. This forces air into the eustachian tube.  · Only take over-the-counter or prescription medicines for pain, discomfort, or fever as directed by your health care provider.  · A decongestant may be helpful in decongesting the middle ear and make pressure equalization easier.  SEEK MEDICAL CARE IF:  · You experience a serious form of dizziness in which you feel as if the room is spinning and you feel nauseated (vertigo).  · Your symptoms only involve one ear.  SEEK IMMEDIATE MEDICAL CARE IF:   · You develop a severe headache, dizziness, or severe ear pain.  · You have bloody or pus-like drainage from your ears.  · You develop a fever.  · Your problems do not improve or become worse.  MAKE SURE YOU:   · Understand these instructions.  · Will watch your condition.  · Will get help right away if you are not doing well or get worse.  Document Released: 06/22/2000 Document Revised: 04/15/2013 Document Reviewed: 01/20/2013  Exit[employer]® Patient Information ©2014 [employer].

## 2013-10-06 NOTE — Assessment & Plan Note (Signed)
Severe back pain, followed by ortho, needs surgery when cleared from cv standpoint - trial of zoloft, anticipate some relief of chronic pain with this (in addition to anxiety as primary indication)

## 2013-10-06 NOTE — Assessment & Plan Note (Signed)
Anxiety, long term xanax use - trial of zoloft 25mg  to be increased to 50mg  in 1 week - f/u with Dr. Armen PickupFunches in 2 weeks - gave refill of xanax with warning that this would not be continued, he seemed to understand my explanation that this is not a good long term medication but could be used prn while we wait for the zoloft to take effect - cautioned patient about increased risk of si with initiation

## 2013-10-06 NOTE — Assessment & Plan Note (Signed)
Bulging TM, bloody nasal discharge, congestion, will treat as bacterial sinusitis - augmentin - ipratropium nasal - advised netty pot and humidifier

## 2013-10-06 NOTE — Progress Notes (Signed)
   Subjective:    Patient ID: Fabiola BackerJames D Pennick, male    DOB: 10/27/1965, 48 y.o.   MRN: [id number]  HPI Patient presents for hospital follow-up. He is very frustrated and anxious about his cardiac condition and what he perceives as his cardiologist missing a heart attack and acting dismissive of his concerns.  He has had no further chest pain since discharge. He had ankle swelling the day after discharge but it quickly resolved.  SINUSITIS  Onset: 2 weeks   Severity: very Worse with: nothing Better with: nothing  Symptoms Cough: no Runny nose: yes Fever: no    Sinus Pressure: yes  Ears Blocked: yes  Teeth Ache: no  Frontal Headache: no  Second sickening: no   PMH Sinusitis or Recurrent OM: yes  PMH Prior Sinus or Ear Surgery: no  Recent antibiotic usage (last 30 days): no  PMH of Diabetes or Immunocompromise: no    Red flags Change in mental state: no Change in vision: no Rash: no    Review of Systems  Constitutional: Negative for fever.  HENT: Positive for congestion, ear pain, nosebleeds, rhinorrhea, sinus pressure and tinnitus. Negative for ear discharge, hearing loss, postnasal drip and sore throat.   Respiratory: Negative for cough and wheezing.   Cardiovascular: Positive for leg swelling. Negative for chest pain and palpitations.  Neurological: Negative for weakness and headaches.  Psychiatric/Behavioral: Positive for sleep disturbance, dysphoric mood and agitation. The patient is nervous/anxious.   All other systems reviewed and are negative.       Objective:   Physical Exam  Nursing note and vitals reviewed. Constitutional: He is oriented to person, place, and time. He appears well-developed and well-nourished. No distress.  HENT:  Head: Normocephalic and atraumatic.  Right Ear: Hearing, tympanic membrane, external ear and ear canal normal.  Left Ear: Hearing, external ear and ear canal normal. Tympanic membrane is bulging.  Nose: Nose normal.  Eyes:  Conjunctivae are normal. Right eye exhibits no discharge. Left eye exhibits no discharge. No scleral icterus.  Cardiovascular: Normal rate, regular rhythm, normal heart sounds and intact distal pulses.   No murmur heard. Pulmonary/Chest: Effort normal and breath sounds normal. No respiratory distress. He has no wheezes.  Abdominal: Soft. Bowel sounds are normal. He exhibits no distension. There is no tenderness.  Musculoskeletal: He exhibits no edema and no tenderness.  Lymphadenopathy:    He has no cervical adenopathy.  Neurological: He is alert and oriented to person, place, and time.  Skin: Skin is warm and dry. He is not diaphoretic.  Psychiatric: His speech is normal and behavior is normal. Judgment and thought content normal. His mood appears anxious. His affect is angry. Cognition and memory are normal. He exhibits a depressed mood.          Assessment & Plan:

## 2014-01-15 ENCOUNTER — Encounter (HOSPITAL_COMMUNITY): Payer: Self-pay | Admitting: Emergency Medicine

## 2014-01-15 ENCOUNTER — Emergency Department (HOSPITAL_COMMUNITY)
Admission: EM | Admit: 2014-01-15 | Discharge: 2014-01-16 | Disposition: A | Discharge status: Home / Self Care | Payer: Medicaid Other | Attending: Emergency Medicine | Admitting: Emergency Medicine

## 2014-01-15 ENCOUNTER — Emergency Department (HOSPITAL_COMMUNITY): Payer: Medicaid Other

## 2014-01-15 DIAGNOSIS — K589 Irritable bowel syndrome without diarrhea: Secondary | ICD-10-CM | POA: Insufficient documentation

## 2014-01-15 DIAGNOSIS — M1990 Unspecified osteoarthritis, unspecified site: Secondary | ICD-10-CM | POA: Diagnosis not present

## 2014-01-15 DIAGNOSIS — I2510 Atherosclerotic heart disease of native coronary artery without angina pectoris: Secondary | ICD-10-CM | POA: Insufficient documentation

## 2014-01-15 DIAGNOSIS — Z87828 Personal history of other (healed) physical injury and trauma: Secondary | ICD-10-CM | POA: Diagnosis not present

## 2014-01-15 DIAGNOSIS — R110 Nausea: Secondary | ICD-10-CM | POA: Insufficient documentation

## 2014-01-15 DIAGNOSIS — E669 Obesity, unspecified: Secondary | ICD-10-CM | POA: Insufficient documentation

## 2014-01-15 DIAGNOSIS — F411 Generalized anxiety disorder: Secondary | ICD-10-CM | POA: Diagnosis not present

## 2014-01-15 DIAGNOSIS — Z7982 Long term (current) use of aspirin: Secondary | ICD-10-CM | POA: Insufficient documentation

## 2014-01-15 DIAGNOSIS — Z9861 Coronary angioplasty status: Secondary | ICD-10-CM | POA: Diagnosis not present

## 2014-01-15 DIAGNOSIS — R1084 Generalized abdominal pain: Secondary | ICD-10-CM | POA: Diagnosis present

## 2014-01-15 DIAGNOSIS — E785 Hyperlipidemia, unspecified: Secondary | ICD-10-CM | POA: Diagnosis not present

## 2014-01-15 DIAGNOSIS — K5732 Diverticulitis of large intestine without perforation or abscess without bleeding: Secondary | ICD-10-CM | POA: Diagnosis not present

## 2014-01-15 DIAGNOSIS — Z9889 Other specified postprocedural states: Secondary | ICD-10-CM | POA: Insufficient documentation

## 2014-01-15 DIAGNOSIS — Z79899 Other long term (current) drug therapy: Secondary | ICD-10-CM | POA: Insufficient documentation

## 2014-01-15 DIAGNOSIS — R197 Diarrhea, unspecified: Secondary | ICD-10-CM | POA: Insufficient documentation

## 2014-01-15 DIAGNOSIS — F17200 Nicotine dependence, unspecified, uncomplicated: Secondary | ICD-10-CM | POA: Diagnosis not present

## 2014-01-15 LAB — BASIC METABOLIC PANEL
Anion gap: 16 — ABNORMAL HIGH (ref 5–15)
BUN: 13 mg/dL (ref 6–23)
CALCIUM: 9.4 mg/dL (ref 8.4–10.5)
CO2: 23 mEq/L (ref 19–32)
CREATININE: 1.05 mg/dL (ref 0.50–1.35)
Chloride: 101 mEq/L (ref 96–112)
GFR calc Af Amer: >90 mL/min (ref >90)
GFR, EST NON AFRICAN AMERICAN: 81 mL/min — AB (ref >90)
GLUCOSE: 100 mg/dL — AB (ref 70–99)
Potassium: 4.6 mEq/L (ref 3.7–5.3)
Sodium: 140 mEq/L (ref 137–147)

## 2014-01-15 LAB — I-STAT TROPONIN, ED: TROPONIN I, POC: 0.01 ng/mL (ref 0.00–0.08)

## 2014-01-15 LAB — CBC
HEMATOCRIT: 42 % (ref 39.0–52.0)
Hemoglobin: 14.4 g/dL (ref 13.0–17.0)
MCH: 29.5 pg (ref 26.0–34.0)
MCHC: 34.3 g/dL (ref 30.0–36.0)
MCV: 86.1 fL (ref 78.0–100.0)
Platelets: 212 10*3/uL (ref 150–400)
RBC: 4.88 MIL/uL (ref 4.22–5.81)
RDW: 12.6 % (ref 11.5–15.5)
WBC: 9.9 10*3/uL (ref 4.0–10.5)

## 2014-01-15 MED ORDER — IOHEXOL 300 MG/ML  SOLN: 25.0000 mL | Freq: Once | INTRAMUSCULAR | Status: AC | PRN

## 2014-01-15 MED ORDER — DICYCLOMINE HCL 10 MG/ML IM SOLN
20.0000 mg | Freq: Once | INTRAMUSCULAR | Status: AC
  Administered 2014-01-16: 20 mg via INTRAMUSCULAR
  Filled 2014-01-15: qty 2

## 2014-01-15 MED ORDER — FENTANYL CITRATE 0.05 MG/ML IJ SOLN
50.0000 ug | Freq: Once | INTRAMUSCULAR | Status: AC
  Administered 2014-01-15: 50 ug via INTRAVENOUS
  Filled 2014-01-15: qty 2

## 2014-01-15 MED ORDER — ONDANSETRON HCL 4 MG/2ML IJ SOLN
4.0000 mg | Freq: Once | INTRAMUSCULAR | Status: AC
  Administered 2014-01-15: 4 mg via INTRAVENOUS
  Filled 2014-01-15: qty 2

## 2014-01-15 NOTE — ED Notes (Signed)
Pt. reports mid chest pain and generalized abdominal pain with nausea onset this evening , denies emesis or diaphoresis , history of CAD / coronary stent , his cardiologist is Dr. Sherlie BanMcClain .
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
## 2014-01-16 ENCOUNTER — Encounter (HOSPITAL_COMMUNITY): Payer: Self-pay

## 2014-01-16 ENCOUNTER — Emergency Department (HOSPITAL_COMMUNITY): Payer: Medicaid Other

## 2014-01-16 LAB — URINALYSIS, ROUTINE W REFLEX MICROSCOPIC
Bilirubin Urine: NEGATIVE
GLUCOSE, UA: NEGATIVE mg/dL
Hgb urine dipstick: NEGATIVE
Ketones, ur: NEGATIVE mg/dL
LEUKOCYTES UA: NEGATIVE
Nitrite: NEGATIVE
PROTEIN: NEGATIVE mg/dL
SPECIFIC GRAVITY, URINE: 1.018 (ref 1.005–1.030)
Urobilinogen, UA: 0.2 mg/dL (ref 0.0–1.0)
pH: 6 (ref 5.0–8.0)

## 2014-01-16 MED ORDER — METRONIDAZOLE 500 MG PO TABS: 500.0000 mg | ORAL_TABLET | Freq: Two times a day (BID) | ORAL | Status: DC

## 2014-01-16 MED ORDER — SODIUM CHLORIDE 0.9 % IJ SOLN
INTRAMUSCULAR | Status: AC
  Filled 2014-01-16: qty 50

## 2014-01-16 MED ORDER — METRONIDAZOLE 500 MG PO TABS
500.0000 mg | ORAL_TABLET | Freq: Once | ORAL | Status: AC
  Administered 2014-01-16: 500 mg via ORAL
  Filled 2014-01-16: qty 1

## 2014-01-16 MED ORDER — PROMETHAZINE HCL 25 MG PO TABS: 25.0000 mg | ORAL_TABLET | Freq: Four times a day (QID) | ORAL | Status: DC | PRN

## 2014-01-16 MED ORDER — IOHEXOL 300 MG/ML  SOLN
100.0000 mL | Freq: Once | INTRAMUSCULAR | Status: AC | PRN
  Administered 2014-01-16: 100 mL via INTRAVENOUS

## 2014-01-16 MED ORDER — CIPROFLOXACIN HCL 500 MG PO TABS
500.0000 mg | ORAL_TABLET | Freq: Once | ORAL | Status: AC
  Administered 2014-01-16: 500 mg via ORAL
  Filled 2014-01-16: qty 1

## 2014-01-16 MED ORDER — CIPROFLOXACIN HCL 500 MG PO TABS: 500.0000 mg | ORAL_TABLET | Freq: Two times a day (BID) | ORAL | Status: DC

## 2014-01-16 MED ORDER — OXYCODONE-ACETAMINOPHEN 5-325 MG PO TABS: 2.0000 | ORAL_TABLET | ORAL | Status: DC | PRN

## 2014-01-16 NOTE — Discharge Instructions (Signed)

## 2014-02-15 ENCOUNTER — Ambulatory Visit (INDEPENDENT_AMBULATORY_CARE_PROVIDER_SITE_OTHER): Payer: Medicaid Other | Admitting: Family Medicine

## 2014-02-15 ENCOUNTER — Encounter: Payer: Self-pay | Admitting: Family Medicine

## 2014-02-15 VITALS — BP 128/77 | HR 53 | Temp 98.8°F | Ht 72.0 in | Wt 232.8 lb

## 2014-02-15 DIAGNOSIS — K5730 Diverticulosis of large intestine without perforation or abscess without bleeding: Secondary | ICD-10-CM

## 2014-02-15 NOTE — Patient Instructions (Signed)
Colon Diverticulosis - you will be referred to an Gastroenterologist for a colonoscopy, our office will call you for an appointment, continue Mylanta as needed for bloating

## 2014-02-15 NOTE — Assessment & Plan Note (Signed)
Patient presents for ED follow up from 01/15/14 where he was diagnosed with sigmoid diverticulosis. Clinically improved s/p course of Ciprofloxacin and Flagyl. -patient referred to GI for colonoscopy -may continue Mylanta for bloating

## 2014-02-15 NOTE — Progress Notes (Signed)
   Subjective:    Patient ID: George [name], male    DOB: 17-Dec-1965, 48 y.o.   MRN: [id number]  HPI 48 y/o male presents for ED follow up, patient evaluated for abdominal and chest pain in early July, diagnosed with sigmoid diverticulosis, treated with Cipro and Flagyl, pain is significantly improved however still having intermittent diffuse abdominal pain, worse in the left lower quadrant, radiates to bilateral groin and occasional chest, sharp pain and pressure, pain is waxing and waning, worse with eating and with movement, taking bland diet, increased flatulence and nausea, no diarrhea or constipation, no blood in stool, taking Mylanta and Pepto-bismol 3-4 times per day which helps symptoms for a short period of time, no fevers, chills at night, intermittent right abdominal cramping, no current chest pain, see by cardiologist (has previous stent that is now occluded).   No previous colonoscopy   Review of Systems  Constitutional: Negative for fever, chills and fatigue.  Respiratory: Negative for chest tightness.   Cardiovascular: Negative for chest pain.  Gastrointestinal: Positive for abdominal pain. Negative for nausea, vomiting and diarrhea.       Objective:   Physical Exam Vitals: reviewed Gen: pleasant male, NAD Cardiac: RRR, S1 and S2 present, no murmurs, no heaves/thrills Resp: CTAB, normal effort Abd: , no scars, soft, no distension, no peritoneal signs, minimal left lower quadrant tenderness, no rebound, no guarding     Assessment & Plan:  Please see problem specific assessment and plan.

## 2014-02-16 ENCOUNTER — Encounter: Payer: Self-pay | Admitting: Nurse Practitioner

## 2014-02-24 ENCOUNTER — Telehealth: Payer: Self-pay | Admitting: *Deleted

## 2014-02-24 ENCOUNTER — Ambulatory Visit (INDEPENDENT_AMBULATORY_CARE_PROVIDER_SITE_OTHER): Payer: Medicaid Other | Admitting: Nurse Practitioner

## 2014-02-24 ENCOUNTER — Encounter: Payer: Self-pay | Admitting: Nurse Practitioner

## 2014-02-24 VITALS — BP 112/70 | HR 68 | Ht 71.0 in | Wt 233.0 lb

## 2014-02-24 DIAGNOSIS — K5732 Diverticulitis of large intestine without perforation or abscess without bleeding: Secondary | ICD-10-CM

## 2014-02-24 DIAGNOSIS — Z1211 Encounter for screening for malignant neoplasm of colon: Secondary | ICD-10-CM

## 2014-02-24 DIAGNOSIS — K589 Irritable bowel syndrome without diarrhea: Secondary | ICD-10-CM

## 2014-02-24 MED ORDER — NA SULFATE-K SULFATE-MG SULF 17.5-3.13-1.6 GM/177ML PO SOLN
1.0000 | Freq: Once | ORAL | Status: AC
Start: 2014-02-24 — End: 2014-03-26

## 2014-02-24 NOTE — Telephone Encounter (Signed)
02/24/2014   RE: George [name] DOB: 09/08/1965 MRN: [id number]   Dear Dr. Marca Anconaalton McLean,    We have scheduled the above patient for an endoscopic procedure. He is scheduled for a screening colonoscopy on 05-05-2014.   After speaking with the patient in the office, he seems to have some cardiac concerns. I reviewed his hospital records and catherization report.  Do you have any concerns with us sedating him for the Colonoscopy ( with Propofol) ?    Please fax back/ or route the completed form to [facility] Dorann Davidson CMA at [phone].   Sincerely,    Willette ClusterPaula Guenther NP-C     Lowry RamPam Jahzier Villalon CMA

## 2014-02-24 NOTE — Patient Instructions (Signed)
You have been scheduled for a colonoscopy. Please follow written instructions given to you at your visit today.   If you use inhalers (even only as needed), please bring them with you on the day of your procedure. Your physician has requested that you go to www.startemmi.com and enter the access code given to you at your visit today. This web site gives a general overview about your procedure. However, you should still follow specific instructions given to you by our office regarding your preparation for the procedure.  We are sending a letter to Dr. Marca Anconaalton McLean advising him of the scheduled colonoscopy. We are asking if he is in agreement with you having the procedure.

## 2014-02-24 NOTE — Telephone Encounter (Signed)
OK for colonoscopy if no unstable symptoms, we will check with him.
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
## 2014-02-25 DIAGNOSIS — Z1211 Encounter for screening for malignant neoplasm of colon: Secondary | ICD-10-CM | POA: Insufficient documentation

## 2014-02-25 DIAGNOSIS — K5732 Diverticulitis of large intestine without perforation or abscess without bleeding: Secondary | ICD-10-CM | POA: Insufficient documentation

## 2014-03-17 ENCOUNTER — Telehealth: Payer: Self-pay | Admitting: Family Medicine

## 2014-03-17 ENCOUNTER — Other Ambulatory Visit: Payer: Self-pay | Admitting: *Deleted

## 2014-03-17 NOTE — Telephone Encounter (Signed)
Pt called and is out of his Zoloft. He take 2- 25 mg tabs a day and was hoping to increase this. If he needs a visit that fine but he really needs a refill of at least the 2-25 mg a day. jw

## 2014-03-18 MED ORDER — SERTRALINE HCL 50 MG PO TABS: 50.0000 mg | ORAL_TABLET | Freq: Every day | ORAL | Status: DC

## 2014-03-18 NOTE — Telephone Encounter (Signed)
Pt informed of RX being sent in and notified that he would have to make an appt if he thinks dose should be increased. Kaegan Hettich CMA

## 2014-03-18 NOTE — Telephone Encounter (Signed)
Please call patient and tell him that prescription for Zoloft 50 mg sent to pharmacy, if he thinks that he needs an increased dose he will need another appointment.  Note to nursing staff - please call patient to let him know that refill has been sent, please offer appointment to discuss titration of Zoloft

## 2014-03-22 ENCOUNTER — Telehealth: Payer: Self-pay | Admitting: *Deleted

## 2014-03-22 ENCOUNTER — Telehealth: Payer: Self-pay | Admitting: Family Medicine

## 2014-03-22 DIAGNOSIS — M5136 Other intervertebral disc degeneration, lumbar region: Secondary | ICD-10-CM

## 2014-03-22 NOTE — Telephone Encounter (Signed)
I received a message from Vonna Kotyk that patient was requesting an appointment with Dr Shirlee Latch. I contacted pt to schedule an appointment for him with Dr Shirlee Latch.  Pt declined to schedule an appointment with Dr Shirlee Latch. He said he had an upcoming appt with a cardiologist in [place] and planned to continue his cardiology care in [facility].

## 2014-03-22 NOTE — Telephone Encounter (Signed)
Needs referral to Dr Ernesto Rutherford at [facility]

## 2014-03-23 NOTE — Telephone Encounter (Signed)
Patient had change in insurance, needs referral to orthopedic surgery, has been following at [facility].

## 2014-03-24 NOTE — Telephone Encounter (Signed)
Appt scheduled at [facility] for 9/21 at 215pm, patient informed.

## 2014-04-05 NOTE — Telephone Encounter (Signed)
I called the patient , George [name], and left a message for him that we heard from Dr. [name]. The doctor's thinks it is ok for him to have a colonoscopy if no unstable symptoms.  His office will check with the patient.

## 2014-04-06 NOTE — Telephone Encounter (Signed)
I called the patient to advise that Dr. Shirlee LatchMcLean said he is ok for the patient to have a colonoscopy.  Dr. Shirlee LatchMcLean said they will contact the patient.  The patient told me that he has left [facility] completely and [facility] I asked who it is but he declined to tell me.  He said he is still trying to decide if he is keeping the appointment with us for 04-25-14. I advised him to cancel within 2-3 days prior to the procedure so he doesn't get charged a cancellation fee.

## 2014-04-08 ENCOUNTER — Encounter: Payer: Self-pay | Admitting: Family Medicine

## 2014-04-08 ENCOUNTER — Telehealth: Payer: Self-pay | Admitting: *Deleted

## 2014-04-08 DIAGNOSIS — M5136 Other intervertebral disc degeneration, lumbar region: Secondary | ICD-10-CM

## 2014-04-08 NOTE — Telephone Encounter (Signed)
Called [name], she will fax over MRI and referral. Will forward to PCP for referral

## 2014-04-08 NOTE — Telephone Encounter (Signed)
Referral placed.

## 2014-04-08 NOTE — Telephone Encounter (Signed)
Dr Charlett BlakeVoytek is requesting that we refer pt to [facility].  You can call Dorris Carnesachel Howard @ [facility] with questions. Breahna Boylen, Maryjo RochesterJessica Dawn

## 2014-04-08 NOTE — Addendum Note (Signed)
Addended by: Uvaldo RisingFLETKE, Egan Berkheimer J on: 04/08/2014 11:56 AM   Modules accepted: Orders

## 2014-04-08 NOTE — Telephone Encounter (Signed)
Referral faxed to [facility], awaiting response.

## 2014-04-09 ENCOUNTER — Telehealth: Payer: Self-pay | Admitting: Family Medicine

## 2014-04-09 NOTE — Telephone Encounter (Signed)
Pt called and needs a referral to Dr. Sharolyn DouglasMax Cohen at [facility] because of the results of his MRI. jw

## 2014-04-09 NOTE — Telephone Encounter (Signed)
Patient contacted and informed that referral was placed yesterday, nursing staff will contact him once referral has been completed.

## 2014-04-14 NOTE — Telephone Encounter (Signed)
[facility] not accepting new medicaid patient for 6 months, called and spoke with Fleet Contrasachel at Dr. Eilleen KempfVoytek's office and she will send message to find out if MD wants patient referred elsewhere.

## 2014-04-19 ENCOUNTER — Telehealth: Payer: Self-pay | Admitting: *Deleted

## 2014-04-19 NOTE — Telephone Encounter (Signed)
Fleet Contrasachel from [facility] called stating that Dr. Yevette Edwardsumonski has agreed to see patient and request new referral for that office.  The fax number [phone]; office number [phone].  Please call [name] with questions/concerns at [phone].  Dr. Yevette Edwardsumonski will see pt to discuss other options; pt will not be prescribed any pain medication.  Clovis PuMartin, Tamika L, RN

## 2014-04-19 NOTE — Telephone Encounter (Signed)
Called to give patient appt with Dr. Yevette Edwardsumonski, however, patient states he has decided to be seen at [facility] instead, called and informed [name] at [facility].

## 2014-05-03 ENCOUNTER — Telehealth: Payer: Self-pay | Admitting: Gastroenterology

## 2014-05-03 NOTE — Telephone Encounter (Signed)
no

## 2014-05-05 ENCOUNTER — Encounter: Payer: Medicaid Other | Admitting: Gastroenterology

## 2014-06-01 ENCOUNTER — Other Ambulatory Visit: Payer: Self-pay | Admitting: Nurse Practitioner

## 2014-06-17 ENCOUNTER — Encounter (HOSPITAL_COMMUNITY): Payer: Self-pay | Admitting: Cardiology

## 2014-07-06 ENCOUNTER — Other Ambulatory Visit: Payer: Self-pay | Admitting: *Deleted

## 2014-07-06 DIAGNOSIS — F411 Generalized anxiety disorder: Secondary | ICD-10-CM

## 2014-07-07 MED ORDER — ALPRAZOLAM 0.5 MG PO TABS: 0.5000 mg | ORAL_TABLET | Freq: Three times a day (TID) | ORAL | Status: DC | PRN

## 2014-07-07 NOTE — Telephone Encounter (Signed)
Note to nursing staff - please call in Xanax 0.5 mg PO Q8 hours prn anxiety, dispense #50, refill #0; please also call patient to schedule follow up for anxiety, thanks

## 2014-07-07 NOTE — Telephone Encounter (Signed)
Patient informed that rx was called in, will callback to schedule an appointment.

## 2014-07-09 ENCOUNTER — Other Ambulatory Visit: Payer: Self-pay | Admitting: Cardiology

## 2014-07-28 ENCOUNTER — Other Ambulatory Visit: Payer: Self-pay | Admitting: Family Medicine

## 2014-07-28 MED ORDER — METOPROLOL TARTRATE 25 MG PO TABS: 25.0000 mg | ORAL_TABLET | Freq: Two times a day (BID) | ORAL | Status: DC

## 2014-07-28 MED ORDER — ISOSORBIDE MONONITRATE ER 30 MG PO TB24: 30.0000 mg | ORAL_TABLET | Freq: Every day | ORAL | Status: DC

## 2014-07-28 NOTE — Progress Notes (Signed)
Updated medication list based on recent cardiology visit at [facility].

## 2014-08-20 ENCOUNTER — Encounter: Payer: Self-pay | Admitting: *Deleted

## 2014-08-20 NOTE — Progress Notes (Signed)
Prior Authorization received from [facility] for Crestor 20 mg tablet. Formulary and PA form placed in provider box for completion. Clovis PuMartin, Cornelio Parkerson L, RN

## 2014-08-24 NOTE — Progress Notes (Signed)
PA form completed and returned to [name]

## 2014-08-24 NOTE — Progress Notes (Signed)
PA pending per [employer] [facility] for Crestor 20 mg tablet. Confirmation number [id number] Charmayne SheerW. Nefertari Rebman L, RN

## 2014-08-25 NOTE — Progress Notes (Signed)
Received PA approval for Crestor 20 mg tablet via [employer] [facility].  Med approved for 08/24/2014 - 08/24/2015.  [facility] pharmacy informed.  PA confirmation number [id number] Link SnufferW. Martin, Bronson Ingamika L, RN

## 2014-08-26 ENCOUNTER — Telehealth: Payer: Self-pay | Admitting: Family Medicine

## 2014-11-19 ENCOUNTER — Telehealth: Payer: Self-pay | Admitting: *Deleted

## 2014-11-19 NOTE — Telephone Encounter (Signed)
Dr Shirlee LatchMcLean received letter dated April 20,2016 from Rex Krasharles Hall, [place] with [employer] stating Mr Margo AyeHall represented Milinda AntisJames Milford on a claim for disability benefits from [employer]. The letter was requesting Dr Shirlee LatchMcLean  complete enclosed form  titled Work Tax inspectorCapacity Evaluation (Physical). A HIPAA COMPLIANT AUTHORIZATION FOR RELEASE OF PATIENT INFORMATION PURSUANT TO 45 CFR 164.508 signed by pt dated 10/27/14 was enclosed with letter.   There is a phone note in Epic dated 03/22/14 stating that pt declined to scheduled an appt with Dr Shirlee LatchMcLean, that he had an upcoming app[facility] a [facility]ologist [facility] and planned to [facility].  Dr Shirlee LatchMcLean received and reviewed this request 11/15/14. Per Dr Radene GunningMcLean--since pt planned to continue cardiology care elsewhere he would be unable to complete form as requested.  Dr Shirlee LatchMcLean recommended that Rex Krasharles Hall, Attorney be notified that notes in Epic indicate pt was not going to continue his cardiology care with Dr Shirlee LatchMcLean.  I contacted [employer] at [phone], phone number on letterhead from his office, and was told I needed to speak with [place]any Tomlin. I was transferred to voice mail for [place]Brittany Tomlin and I left a message on voice mail to call me back.

## 2015-05-02 ENCOUNTER — Telehealth: Payer: Self-pay | Admitting: Family Medicine

## 2015-10-07 ENCOUNTER — Encounter: Payer: Self-pay | Admitting: Family Medicine

## 2015-10-07 ENCOUNTER — Ambulatory Visit (INDEPENDENT_AMBULATORY_CARE_PROVIDER_SITE_OTHER): Payer: Medicaid Other | Admitting: Family Medicine

## 2015-10-07 VITALS — BP 114/79 | HR 84 | Temp 99.0°F | Wt 223.0 lb

## 2015-10-07 DIAGNOSIS — G479 Sleep disorder, unspecified: Secondary | ICD-10-CM | POA: Diagnosis not present

## 2015-10-07 DIAGNOSIS — B372 Candidiasis of skin and nail: Secondary | ICD-10-CM | POA: Diagnosis not present

## 2015-10-07 DIAGNOSIS — R499 Unspecified voice and resonance disorder: Secondary | ICD-10-CM

## 2015-10-07 DIAGNOSIS — J029 Acute pharyngitis, unspecified: Secondary | ICD-10-CM

## 2015-10-07 LAB — POCT RAPID STREP A (OFFICE): RAPID STREP A SCREEN: NEGATIVE

## 2015-10-07 MED ORDER — CLOTRIMAZOLE 1 % EX CREA: 1.0000 "application " | TOPICAL_CREAM | Freq: Two times a day (BID) | CUTANEOUS | Status: DC

## 2015-10-07 NOTE — Patient Instructions (Signed)
Thank you for coming in to clinic today.  1. Your rash is a Yeast or Candidal Infection - Start with rx Clotrimazole cream - apply small amount twice a day for at least 1-2 weeks, use until healed, max duration is 4 weeks - If not helping by 1 week or worsening, contact us and we can change medication to see if alternative works better  2. For your sore throat - strep is negative - Likely from snoring, oropharynx muscles and other problems as discussed - Due to hoarseness will refer to ENT for evaluation, direct laryngoscopy (they should look with a light) to rule out potential throat cancer, discuss other concerns with sleeping / snoring symptoms, may need anther sleep study in future - May try warm herbal tea with honey for throat irritation for now to reduce swelling  Please schedule a follow-up appointment with Dr Randolm IdolFletke in 2 to 4 weeks for Annual Physical, also to re-check Yeast Rash  If you have any other questions or concerns, please feel free to call the clinic to contact me. You may also schedule an earlier appointment if necessary.  However, if your symptoms get significantly worse, please go to the Emergency Department to seek immediate medical attention.  Saralyn PilarAlexander Karamalegos, DO [facility]

## 2015-10-07 NOTE — Progress Notes (Signed)
Subjective:    Patient ID: George [name], male    DOB: 02/08/1966, 50 y.o.   MRN: [id number]  George BackerJames D Schuessler is a 50 y.o. male presenting on 10/07/2015 for Rash   Patient presents for a same day appointment.   HPI  GROIN RASH: - Reported symptoms started about 2 weeks ago with red irritated rash in bilateral groin, seems to be intermittently improving and worsening. He was suspicious for a "heat rash" tried topical Desitin, then switched to vitamin A & E, medicated goldbond drying powder, none of these with relief. No change of detergents or other environmental exposures. - Admits itching - Denies fever/chills, spreading rash except in groin / perineal region, pustules or draining  HOARSE VOICE / SORE THROAT: - Reports prior history >12 months ago had a "swollen uvula" seemed to be enlarged uvula swollen and had "bleeding from uvula", did not seek medical care, tried anti-inflammatory meds seemed to improve. - Presents today for similar problem, chronic recurrent over past 8-10 months with several days at a time of sore throat and dry mouth, not related to specific illness. Now worsening x past few weeks with hoarse voice. - Additional history reported with a sleep study in 2009 [facility] Shelle Ironlance) with dx mild OSA but elevated RDI events 6 per hour, never treated with CPAP but complains of waking up frequently at night due to this and has dry mouth - Currently smokes E-cigarettes, former tobacco smoker, quit cigarettes in >3 years, smoking history of 2ppd >30 years previously - Admits dry mouth (sometimes after waking up) - Denies any dysphagia, unexpected weight loss, night sweats, SOB, chest pain, nausea, vomiting  Social History  Substance Use Topics  . Smoking status: Current Every Day Smoker -- 1.50 packs/day    Types: E-cigarettes    Last Attempt to Quit: 04/20/2013  . Smokeless tobacco: Never Used     Comment: Smoking 2-3 ppd x 28 years   . Alcohol Use: Yes     Comment:  occasional    Review of Systems Per HPI unless specifically indicated above     Objective:    BP 114/79 mmHg  Pulse 84  Temp(Src) 99 F (37.2 C) (Oral)  Wt 223 lb (101.152 kg)  Wt Readings from Last 3 Encounters:  10/07/15 223 lb (101.152 kg)  02/24/14 233 lb (105.688 kg)  02/15/14 232 lb 12.8 oz (105.597 kg)    Physical Exam  Constitutional: He appears well-developed and well-nourished. No distress.  HENT:  Head: Normocephalic and atraumatic.  Mouth/Throat: Oropharynx is clear and moist.  Oropharynx clear, uvula with mild edematous appearance without exudate, tonsils clear without erythema, edema, exudate or asymmetry. No mass or ulceration.  Neck: Normal range of motion. Neck supple. No thyromegaly present.  Cardiovascular: Normal rate, regular rhythm, normal heart sounds and intact distal pulses.   No murmur heard. Pulmonary/Chest: Effort normal and breath sounds normal. No respiratory distress. He has no wheezes. He has no rales.  Lymphadenopathy:    He has no cervical adenopathy.  Neurological: He is alert.  Skin: Skin is warm and dry. Rash (erythematous appearing patches with some superficial skin sloughing and scattered satellite lesions localized in folds of bilateral groin, extending perineal region) noted. He is not diaphoretic.  Nursing note and vitals reviewed.  Results for orders placed or performed in visit on 10/07/15  POCT rapid strep A  Result Value Ref Range   Rapid Strep A Screen Negative Negative      Assessment &  Plan:   Problem List Items Addressed This Visit    Candidal intertrigo - Primary    Bilateral groin folds and perineal. No evidence of complication. - Start rx Clotrimazole x 1-2 weeks, max duration up to 4 weeks, consider alternative agent if not responding      Relevant Medications   clotrimazole (LOTRIMIN) 1 % cream   Disturbance in sleep behavior    Likely contributing to dry mouth and sore throat - After ENT, patient may need to  follow-up again with Pulm vs repeat sleep study      Hoarseness or changing voice    Suspected related to chronic underlying sleep disorder with mild OSA and dry mouth, also with recent allergies and post-nasal drip. However given extensive tobacco history, cannot rule out oropharyngeal malignancy now with worsening to hoarseness - Will refer to ENT, patient requests to wait until he can get name of preferred ENT (treated his wife before), will placed referral order once he leaves message with this info next week - Return criteria given       Other Visit Diagnoses    Sore throat        triage staff ordered rapid strep initially, this was unlikely, with low risk patient and no recent exposure, exam/history not supportive    Relevant Orders    POCT rapid strep A (Completed)       Meds ordered this encounter  Medications  . clotrimazole (LOTRIMIN) 1 % cream    Sig: Apply 1 application topically 2 (two) times daily. Up to 2-4 weeks.    Dispense:  30 g    Refill:  0      Follow up plan: Return in about 4 weeks (around 11/04/2015) for re-check rash, physical.  Saralyn Pilar, DO [facility], PGY-3

## 2015-10-07 NOTE — Assessment & Plan Note (Signed)
Suspected related to chronic underlying sleep disorder with mild OSA and dry mouth, also with recent allergies and post-nasal drip. However given extensive tobacco history, cannot rule out oropharyngeal malignancy now with worsening to hoarseness - Will refer to ENT, patient requests to wait until he can get name of preferred ENT (treated his wife before), will placed referral order once he leaves message with this info next week - Return criteria given

## 2015-10-07 NOTE — Assessment & Plan Note (Signed)
Likely contributing to dry mouth and sore throat - After ENT, patient may need to follow-up again with Pulm vs repeat sleep study

## 2015-10-07 NOTE — Assessment & Plan Note (Signed)
Bilateral groin folds and perineal. No evidence of complication. - Start rx Clotrimazole x 1-2 weeks, max duration up to 4 weeks, consider alternative agent if not responding

## 2015-10-10 ENCOUNTER — Telehealth: Payer: Self-pay | Admitting: Family Medicine

## 2015-10-10 DIAGNOSIS — R499 Unspecified voice and resonance disorder: Secondary | ICD-10-CM

## 2015-10-10 NOTE — Telephone Encounter (Signed)
Will forward to Dr. Althea CharonKaramalegos who mentions referring patient to ENT in office visit on 3/31 but I see no referral in chart.

## 2015-10-10 NOTE — Telephone Encounter (Signed)
Last saw patient on 10/07/15, advised patient that plan was to refer to ENT for hoarseness and voice change chronically intermittently over >6 months, see note for further details. Was waiting on patient to provide specific requested ENT doctor's name (who also treated his wife). Ordered referral today 10/10/15 [facility] Emeline DarlingGore.  Saralyn PilarAlexander Delania Ferg, DO [facility], PGY-3

## 2015-10-10 NOTE — Telephone Encounter (Signed)
Pt called and wanted to give Dr. Randolm IdolFletke the name of the ENT he would like to see. He wants to see [facility]ore [facility]. Myriam Jacobsonjw

## 2015-10-12 ENCOUNTER — Other Ambulatory Visit: Payer: Self-pay | Admitting: Family Medicine

## 2015-10-12 DIAGNOSIS — B372 Candidiasis of skin and nail: Secondary | ICD-10-CM

## 2015-10-12 MED ORDER — CLOTRIMAZOLE 1 % EX CREA: 1.0000 "application " | TOPICAL_CREAM | Freq: Two times a day (BID) | CUTANEOUS | Status: DC

## 2015-10-12 NOTE — Telephone Encounter (Signed)
Need refill on the clotrimazole cream.

## 2016-12-12 DIAGNOSIS — R001 Bradycardia, unspecified: Secondary | ICD-10-CM | POA: Diagnosis not present

## 2016-12-12 DIAGNOSIS — R0789 Other chest pain: Secondary | ICD-10-CM | POA: Diagnosis not present

## 2016-12-20 ENCOUNTER — Inpatient Hospital Stay: Payer: Medicaid Other | Admitting: Family Medicine

## 2017-01-10 ENCOUNTER — Encounter: Payer: Self-pay | Admitting: Internal Medicine

## 2017-01-10 ENCOUNTER — Ambulatory Visit (INDEPENDENT_AMBULATORY_CARE_PROVIDER_SITE_OTHER): Payer: Medicaid Other | Admitting: Internal Medicine

## 2017-01-10 VITALS — BP 102/78 | HR 52 | Temp 98.0°F | Ht 71.0 in | Wt 236.0 lb

## 2017-01-10 DIAGNOSIS — I10 Essential (primary) hypertension: Secondary | ICD-10-CM | POA: Insufficient documentation

## 2017-01-10 DIAGNOSIS — F411 Generalized anxiety disorder: Secondary | ICD-10-CM

## 2017-01-10 DIAGNOSIS — I2510 Atherosclerotic heart disease of native coronary artery without angina pectoris: Secondary | ICD-10-CM | POA: Diagnosis present

## 2017-01-10 DIAGNOSIS — F17200 Nicotine dependence, unspecified, uncomplicated: Secondary | ICD-10-CM | POA: Diagnosis not present

## 2017-01-10 MED ORDER — ROSUVASTATIN CALCIUM 40 MG PO TABS: 40.0000 mg | ORAL_TABLET | Freq: Every day | ORAL | 3 refills | Status: DC

## 2017-01-10 MED ORDER — HYDROXYZINE HCL 25 MG PO TABS: 25.0000 mg | ORAL_TABLET | Freq: Three times a day (TID) | ORAL | 0 refills | Status: DC | PRN

## 2017-01-10 NOTE — Patient Instructions (Addendum)
Please follow up every 3-4 months. Recheck blood pressure and see how your anxiety is holding up.

## 2017-01-10 NOTE — Assessment & Plan Note (Signed)
CAD, noted to have normal EF with, systolic function is low normal with calculated ejection fraction of 53%. Recent had nuclear stress test which noted no inducible ischemia - patient with symptoms of angina - SOB and chest pressure - seem to improve with control of GERD and Anxiety. [facility]  - Continue metropolol 12.5 mg BID, aspirin 81 mg, Imdur 30 mg, Rovastatin 40 mg - Continue to follow with cardiology  - Follow up as needed with me in about 3 months - patient is reticent to follow up any sooner

## 2017-01-10 NOTE — Assessment & Plan Note (Signed)
Not currently interested in quittting

## 2017-01-10 NOTE — Assessment & Plan Note (Signed)
No issues with medication, not interested in quitting smoking  - Continue lisinopril and metoprolol  - Follow up in 3 months

## 2017-01-10 NOTE — Progress Notes (Signed)
   [facility] Noralee CharsAsiyah Azalie Harbeck, MD Phone: [phone]  Reason For Visit: Hospital f/u  Hospital F/U   # [facility] pressure in chest and pounding in ears, has had two heart attacks in the past and has atypical symptoms commonly. [facility] out. Patient had a nuclear stress test, no definite evidence of transmural scar or inducible ischemia. Per cardiology was having stress induced angina attacks. Was discharged from the hospital the 7th of June. Has been started on vistiril for anxiety and would like to continue taking this medication as he states it helps with his symptoms. Saw cardiology - on June 29. Patient was also on protonix for GERD to help with chest pressure symptoms. Angina - has had them a few times - noticed them more since he has stopped visitril, symptoms tend to be well controlled when patient takes   Cardiac Hx  Per Chart Review  Last cath 06/2013 long 60% pLAD, occluded LCx stent with OM beyond this, diffuse luminal irregularities in RCA, EF 65%. Echo 12/14 revealed normal LV function with EF 55-60%.   Medication changes  - Metropolol 12.5 mg  - Imdur 30 mg  - Rosuvastatin 40 mg  Daily  - prescribed nitroglycerin - has not needed this discharged  - Continue aspirin  81 mg   CHRONIC HTN: Well controlled  Current Meds - Lisinopril, metoprolol 12.5 mg  Reports good compliance, took meds today. Tolerating well, w/o complaints. Lifestyle - still smokes, has a lot of stress in life not willing to give this up  Denies CP, dyspnea, HA, edema, dizziness / lightheadedness  Past Medical History Reviewed problem list.  Medications- reviewed and updated No additions to family history Social history- patient is a smoker- 1/4 of pack per day   Objective: BP 102/78   Pulse (!) 52   Temp 98 F (36.7 C) (Oral)   Ht 5\' 11"  (1.803 m)   Wt 236 lb (107 kg)   SpO2 93%   BMI 32.92 kg/m  Gen: NAD, alert, cooperative  with exam Cardio: regular rate and rhythm, S1S2 heard, no murmurs appreciated Pulm: clear to auscultation bilaterally, no wheezes, rhonchi or rales Skin: dry, intact, no rashes or lesions  Assessment/Plan: See problem based a/p  CAD (coronary artery disease) CAD, noted to have normal EF with, systolic function is low normal with calculated ejection fraction of 53%. Recent had nuclear stress test which noted no inducible ischemia - patient with symptoms of angina - SOB and chest pressure - seem to improve with control of GERD and Anxiety. [facility]  - Continue metropolol 12.5 mg BID, aspirin 81 mg, Imdur 30 mg, Rovastatin 40 mg - Continue to follow with cardiology  - Follow up as needed with me in about 3 months - patient is reticent to follow up any sooner   [employer] Not currently interested in quittting   HTN (hypertension) No issues with medication, not interested in quitting smoking  - Continue lisinopril and metoprolol  - Follow up in 3 months

## 2017-02-14 ENCOUNTER — Telehealth: Payer: Self-pay | Admitting: Internal Medicine

## 2017-02-14 DIAGNOSIS — I2510 Atherosclerotic heart disease of native coronary artery without angina pectoris: Secondary | ICD-10-CM

## 2017-02-14 MED ORDER — METOPROLOL TARTRATE 25 MG PO TABS: 25.0000 mg | ORAL_TABLET | Freq: Two times a day (BID) | ORAL | 3 refills | Status: DC

## 2017-02-14 MED ORDER — LISINOPRIL 5 MG PO TABS: 5.0000 mg | ORAL_TABLET | Freq: Every day | ORAL | 2 refills | Status: DC

## 2017-02-14 MED ORDER — ISOSORBIDE MONONITRATE ER 30 MG PO TB24: 30.0000 mg | ORAL_TABLET | Freq: Every day | ORAL | 0 refills | Status: DC

## 2017-02-14 MED ORDER — ASPIRIN 81 MG PO CHEW: 81.0000 mg | CHEWABLE_TABLET | Freq: Every day | ORAL | 0 refills | Status: DC

## 2017-02-14 MED ORDER — ROSUVASTATIN CALCIUM 40 MG PO TABS: 40.0000 mg | ORAL_TABLET | Freq: Every day | ORAL | 3 refills | Status: DC

## 2017-02-14 NOTE — Telephone Encounter (Signed)
Pt is calling because last week his wife had an appointment with her doctor and he gave her a list of his medications he is taking and the error that were made in them and also which ones needed refilling. His wife gave the list to a nurse and was told that she would give this to his PCP. So far he has not received any refills and he needs his medications and the errors corrected. I asked him what they were and he said that it is on the paper we have. I didn't see any notes in the chart. Did we get the paper? Please call patient to discuss since he is very upset over this. jw

## 2017-02-14 NOTE — Telephone Encounter (Addendum)
Tried to call patient to discuss his concern, no answer. Please call patient back and have him send in a prescription of medications that he needs refilled. I apologize for the delay. I will refill the medications we discussed at his visit now. If he has others he needs refilled he can have the pharmacy contact me with these.

## 2017-02-14 NOTE — Addendum Note (Signed)
Addended by: Noralee CharsMIKELL, ASIYAH Z on: 02/14/2017 01:52 PM   Modules accepted: Orders

## 2017-02-18 NOTE — Telephone Encounter (Signed)
Patient called in stating his meds were not sent to Pharmacy. Sp[facility] at [facility] who states they received lisinopril, asa will be OTC and it's too early for pt to pick up rosuvastatin. They did not receive isosorbid or metoprolol. These were [facility] by this RN. Kinnie FeilL. Leanah Kolander, RN, BSN

## 2017-03-01 ENCOUNTER — Other Ambulatory Visit: Payer: Self-pay | Admitting: Internal Medicine

## 2017-03-01 DIAGNOSIS — F411 Generalized anxiety disorder: Secondary | ICD-10-CM

## 2017-04-08 ENCOUNTER — Other Ambulatory Visit: Payer: Self-pay | Admitting: *Deleted

## 2017-04-08 MED ORDER — ISOSORBIDE MONONITRATE ER 30 MG PO TB24: 30.0000 mg | ORAL_TABLET | Freq: Every day | ORAL | 0 refills | Status: DC

## 2017-04-09 ENCOUNTER — Other Ambulatory Visit: Payer: Self-pay | Admitting: *Deleted

## 2017-04-09 MED ORDER — ISOSORBIDE MONONITRATE ER 30 MG PO TB24
30.0000 mg | ORAL_TABLET | Freq: Every day | ORAL | 0 refills | Status: DC
Start: 2017-04-09 — End: 2017-05-09

## 2017-05-09 ENCOUNTER — Other Ambulatory Visit: Payer: Self-pay | Admitting: Internal Medicine

## 2017-05-09 DIAGNOSIS — F411 Generalized anxiety disorder: Secondary | ICD-10-CM

## 2017-06-07 ENCOUNTER — Other Ambulatory Visit: Payer: Self-pay | Admitting: Internal Medicine

## 2017-06-07 DIAGNOSIS — F411 Generalized anxiety disorder: Secondary | ICD-10-CM

## 2017-08-19 ENCOUNTER — Other Ambulatory Visit: Payer: Self-pay | Admitting: *Deleted

## 2017-08-19 DIAGNOSIS — F411 Generalized anxiety disorder: Secondary | ICD-10-CM

## 2017-08-20 MED ORDER — HYDROXYZINE HCL 25 MG PO TABS: 25.0000 mg | ORAL_TABLET | Freq: Three times a day (TID) | ORAL | 0 refills | Status: DC | PRN

## 2017-09-30 ENCOUNTER — Other Ambulatory Visit: Payer: Self-pay

## 2017-09-30 DIAGNOSIS — F411 Generalized anxiety disorder: Secondary | ICD-10-CM

## 2017-09-30 MED ORDER — HYDROXYZINE HCL 25 MG PO TABS: 25.0000 mg | ORAL_TABLET | Freq: Three times a day (TID) | ORAL | 0 refills | Status: DC | PRN

## 2017-10-03 ENCOUNTER — Other Ambulatory Visit: Payer: Self-pay

## 2017-10-03 MED ORDER — ISOSORBIDE MONONITRATE ER 30 MG PO TB24: 30.0000 mg | ORAL_TABLET | Freq: Every day | ORAL | 1 refills | Status: DC

## 2017-11-06 ENCOUNTER — Encounter: Payer: Self-pay | Admitting: Family Medicine

## 2017-11-06 ENCOUNTER — Ambulatory Visit: Payer: Medicaid Other | Admitting: Family Medicine

## 2017-11-06 DIAGNOSIS — J0101 Acute recurrent maxillary sinusitis: Secondary | ICD-10-CM

## 2017-11-06 DIAGNOSIS — J329 Chronic sinusitis, unspecified: Secondary | ICD-10-CM | POA: Insufficient documentation

## 2017-11-06 DIAGNOSIS — I2510 Atherosclerotic heart disease of native coronary artery without angina pectoris: Secondary | ICD-10-CM

## 2017-11-06 DIAGNOSIS — F411 Generalized anxiety disorder: Secondary | ICD-10-CM

## 2017-11-06 MED ORDER — ISOSORBIDE MONONITRATE ER 30 MG PO TB24: 30.0000 mg | ORAL_TABLET | Freq: Every day | ORAL | 0 refills | Status: DC

## 2017-11-06 MED ORDER — HYDROXYZINE HCL 25 MG PO TABS: 25.0000 mg | ORAL_TABLET | Freq: Three times a day (TID) | ORAL | 0 refills | Status: DC | PRN

## 2017-11-06 MED ORDER — LISINOPRIL 5 MG PO TABS: 5.0000 mg | ORAL_TABLET | Freq: Every day | ORAL | 0 refills | Status: DC

## 2017-11-06 MED ORDER — AMOXICILLIN-POT CLAVULANATE 875-125 MG PO TABS
1.0000 | ORAL_TABLET | Freq: Two times a day (BID) | ORAL | 0 refills | Status: DC
Start: 2017-11-06 — End: 2019-01-20

## 2017-11-06 MED ORDER — ROSUVASTATIN CALCIUM 40 MG PO TABS: 40.0000 mg | ORAL_TABLET | Freq: Every day | ORAL | 0 refills | Status: DC

## 2017-11-06 NOTE — Progress Notes (Signed)
Subjective  George [name] is a 52 y.o. male is presenting with the following  SINUSITIS Three weeks of nasal congestion now with purulent PND.  Mild fever.  Aching in R face and occsl swelling around R eye that comes and goes.  Decreased hearing with stuffy ears.  Using Netti pot which helps but has not resolved.  No dble vision or loss of vision or pain with eye movement.  No chest pain or rash or lip swelling or problems swallowing   Chief Complaint noted Review of Symptoms - see HPI PMH - Smoking status noted.  Chronic back pain, CAD  Objective Vital Signs reviewed BP 125/60 (BP Location: Right Arm, Patient Position: Sitting, Cuff Size: Normal)   Pulse 60   Temp 98.8 F (37.1 C) (Oral)   Wt 237 lb 12.8 oz (107.9 kg)   SpO2 96%   BMI 33.17 kg/m  Alert nad Neck:  No deformities, thyromegaly, masses, or tenderness noted.   Supple with full range of motion without pain. Lungs:  Normal respiratory effort, chest expands symmetrically. Lungs are clear to auscultation, no crackles or wheezes. Heart - Regular rate and rhythm.  No murmurs, gallops or rubs.    Skin:  Intact without suspicious lesions or rashes Eye - Pupils Equal Round Reactive to light, Extraocular movements intact,  Conjunctiva without redness or discharge.  No noticeable ocular swelling Tender of R maxillary sinus Mouth - no lesions, mucous membranes are moist, no decaying teeth   Throat: normal mucosa, no exudate, uvula midline, no redness     Assessments/Plans  See after visit summary for details of patient instuctions  Sinusitis New onset.  Given duration and focality bacterial sinusitis is likely.  Also given chronic illnesses will treat with antibiotics.  No signs of orbital involvement or pneumonia.   Precautions given  I refilled his chronic medications for one month since he is almost out

## 2017-11-06 NOTE — Patient Instructions (Addendum)
Good to see you today!  Thanks for coming in.  For the sinus infection take Augmentin twice a day until all gone   If the eye swelling gets or have high fever or change in vision then you should be seen right away   I refilled your medications for one month.  Make an appointment with Dr Otho Najjar for a follow up

## 2017-11-06 NOTE — Assessment & Plan Note (Signed)
New onset.  Given duration and focality bacterial sinusitis is likely.  Also given chronic illnesses will treat with antibiotics.  No signs of orbital involvement or pneumonia.   Precautions given

## 2017-12-08 ENCOUNTER — Other Ambulatory Visit: Payer: Self-pay | Admitting: Family Medicine

## 2017-12-08 DIAGNOSIS — F411 Generalized anxiety disorder: Secondary | ICD-10-CM

## 2018-02-05 ENCOUNTER — Other Ambulatory Visit: Payer: Self-pay | Admitting: Family Medicine

## 2018-02-05 ENCOUNTER — Other Ambulatory Visit: Payer: Self-pay | Admitting: Internal Medicine

## 2018-02-05 ENCOUNTER — Encounter: Payer: Self-pay | Admitting: Student in an Organized Health Care Education/Training Program

## 2018-02-05 DIAGNOSIS — F411 Generalized anxiety disorder: Secondary | ICD-10-CM

## 2018-02-05 DIAGNOSIS — I10 Essential (primary) hypertension: Secondary | ICD-10-CM

## 2018-02-06 DIAGNOSIS — M48061 Spinal stenosis, lumbar region without neurogenic claudication: Secondary | ICD-10-CM | POA: Diagnosis not present

## 2018-02-12 DIAGNOSIS — M545 Low back pain: Secondary | ICD-10-CM | POA: Diagnosis not present

## 2018-03-24 ENCOUNTER — Ambulatory Visit: Payer: Medicaid Other | Admitting: Student in an Organized Health Care Education/Training Program

## 2018-03-24 ENCOUNTER — Other Ambulatory Visit: Payer: Self-pay

## 2018-03-24 ENCOUNTER — Encounter: Payer: Self-pay | Admitting: Student in an Organized Health Care Education/Training Program

## 2018-03-24 VITALS — BP 122/80 | HR 76 | Temp 98.5°F | Ht 71.0 in | Wt 231.4 lb

## 2018-03-24 DIAGNOSIS — K644 Residual hemorrhoidal skin tags: Secondary | ICD-10-CM | POA: Diagnosis not present

## 2018-03-24 DIAGNOSIS — I25708 Atherosclerosis of coronary artery bypass graft(s), unspecified, with other forms of angina pectoris: Secondary | ICD-10-CM

## 2018-03-24 DIAGNOSIS — I2510 Atherosclerotic heart disease of native coronary artery without angina pectoris: Secondary | ICD-10-CM

## 2018-03-24 DIAGNOSIS — M47812 Spondylosis without myelopathy or radiculopathy, cervical region: Secondary | ICD-10-CM | POA: Insufficient documentation

## 2018-03-24 MED ORDER — HYDROCORTISONE 2.5 % RE CREA
TOPICAL_CREAM | RECTAL | 1 refills | Status: DC
Start: 2018-03-24 — End: 2019-01-20

## 2018-03-24 MED ORDER — METOPROLOL TARTRATE 25 MG PO TABS: 25.0000 mg | ORAL_TABLET | Freq: Two times a day (BID) | ORAL | 3 refills | Status: DC

## 2018-03-24 MED ORDER — ASPIRIN 81 MG PO CHEW: 81.0000 mg | CHEWABLE_TABLET | Freq: Every day | ORAL | 0 refills | Status: DC

## 2018-03-24 MED ORDER — ROSUVASTATIN CALCIUM 40 MG PO TABS: 40.0000 mg | ORAL_TABLET | Freq: Every day | ORAL | 0 refills | Status: DC

## 2018-03-24 MED ORDER — LISINOPRIL 5 MG PO TABS: 5.0000 mg | ORAL_TABLET | Freq: Every day | ORAL | 3 refills | Status: DC

## 2018-03-24 MED ORDER — NITROGLYCERIN 0.4 MG SL SUBL: 0.4000 mg | SUBLINGUAL_TABLET | SUBLINGUAL | 1 refills | Status: DC | PRN

## 2018-03-24 MED ORDER — POLYETHYLENE GLYCOL 3350 17 G PO PACK: 17.0000 g | PACK | Freq: Every day | ORAL | Status: AC
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
## 2018-03-24 NOTE — Patient Instructions (Signed)
It was a pleasure to see you today!  To summarize our discussion for this visit:  You have a posterior external hemorrhoid and I will prescribe a topical cream to apply twice a day for a total of 5 days.  I will also prescribe a stool softener to help with the pain until the hemorrhoid resolves. This can be taken to help prevent it from recurring as well  I will be ordering an xray of your neck for the new neck symptoms  I will call or mail you the results  I will refill your heart medications  Some additional health maintenance measures we should update are: . Colonoscopy is over due- once you are healed and feeling better . Tetanus vaccine . Flu vaccine . Regular maintenance for Hep C and HIV testing is due   Please return to our clinic to see me in 4 months for follow up or sooner if needed.  Call the clinic at [phone] if your symptoms worsen or you have any concerns.  Thank you for allowing me to take part in your care,  Dr. Jamelle Rushinghelsey Zyire Eidson   Thanks for choosing [facility] for your primary care.

## 2018-03-24 NOTE — Assessment & Plan Note (Signed)
-   prescribed anusol to be applied BID for 5 days - prescribed miralax for therapy and prophylaxis - return if not resolved after treatment

## 2018-03-24 NOTE — Assessment & Plan Note (Signed)
-   ordered cervical spine xray - ordered PT - return if not resolved after treatment

## 2018-03-24 NOTE — Assessment & Plan Note (Signed)
Stable -refilled current prescriptions

## 2018-03-29 ENCOUNTER — Encounter: Payer: Self-pay | Admitting: Student in an Organized Health Care Education/Training Program

## 2018-03-29 ENCOUNTER — Telehealth: Payer: Self-pay | Admitting: Student in an Organized Health Care Education/Training Program

## 2018-03-29 NOTE — Telephone Encounter (Signed)
Called patient cell and home number with no answer. Not able to leave message. Sent [employer]. Inquiring that c-spine xray order has not been used yet that was ordered at last appointment 9/16.

## 2018-05-20 ENCOUNTER — Other Ambulatory Visit: Payer: Self-pay

## 2018-05-20 ENCOUNTER — Other Ambulatory Visit: Payer: Self-pay | Admitting: Student in an Organized Health Care Education/Training Program

## 2018-05-20 DIAGNOSIS — I10 Essential (primary) hypertension: Secondary | ICD-10-CM

## 2018-05-20 DIAGNOSIS — I25708 Atherosclerosis of coronary artery bypass graft(s), unspecified, with other forms of angina pectoris: Secondary | ICD-10-CM

## 2018-05-20 DIAGNOSIS — I2510 Atherosclerotic heart disease of native coronary artery without angina pectoris: Secondary | ICD-10-CM

## 2018-05-20 MED ORDER — ROSUVASTATIN CALCIUM 40 MG PO TABS: 40.0000 mg | ORAL_TABLET | Freq: Every day | ORAL | 0 refills | Status: DC

## 2018-05-20 NOTE — Telephone Encounter (Signed)
Can you please ask this patient to come in for lab draw to check his lipids? I will put in an order to be standing for 1 month. Thanks
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
## 2018-07-03 ENCOUNTER — Other Ambulatory Visit: Payer: Self-pay | Admitting: Family Medicine

## 2018-08-01 NOTE — Telephone Encounter (Signed)
ERROR

## 2018-08-29 ENCOUNTER — Other Ambulatory Visit: Payer: Self-pay | Admitting: Student in an Organized Health Care Education/Training Program

## 2018-08-29 DIAGNOSIS — I25708 Atherosclerosis of coronary artery bypass graft(s), unspecified, with other forms of angina pectoris: Secondary | ICD-10-CM

## 2018-08-29 DIAGNOSIS — I2510 Atherosclerotic heart disease of native coronary artery without angina pectoris: Secondary | ICD-10-CM

## 2018-08-29 DIAGNOSIS — F411 Generalized anxiety disorder: Secondary | ICD-10-CM

## 2018-10-27 ENCOUNTER — Other Ambulatory Visit: Payer: Self-pay | Admitting: *Deleted

## 2018-10-27 ENCOUNTER — Other Ambulatory Visit: Payer: Self-pay | Admitting: Student in an Organized Health Care Education/Training Program

## 2018-10-27 DIAGNOSIS — F411 Generalized anxiety disorder: Secondary | ICD-10-CM

## 2018-10-29 MED ORDER — HYDROXYZINE HCL 25 MG PO TABS: 25.0000 mg | ORAL_TABLET | Freq: Every evening | ORAL | 0 refills | Status: DC | PRN

## 2018-11-10 DIAGNOSIS — M5136 Other intervertebral disc degeneration, lumbar region: Secondary | ICD-10-CM | POA: Diagnosis not present

## 2018-11-10 DIAGNOSIS — M545 Low back pain: Secondary | ICD-10-CM | POA: Diagnosis not present

## 2018-11-10 DIAGNOSIS — M179 Osteoarthritis of knee, unspecified: Secondary | ICD-10-CM | POA: Diagnosis not present

## 2018-11-18 ENCOUNTER — Telehealth: Payer: Self-pay

## 2018-11-18 NOTE — Telephone Encounter (Signed)
Attempted to call patient to schedule and appointment for health maintenance.  No answer on cell or home phone.  Glennie Hawk, CMA

## 2018-11-20 DIAGNOSIS — F4320 Adjustment disorder, unspecified: Secondary | ICD-10-CM | POA: Diagnosis not present

## 2018-11-24 ENCOUNTER — Other Ambulatory Visit: Payer: Self-pay | Admitting: Student in an Organized Health Care Education/Training Program

## 2018-11-24 DIAGNOSIS — M5136 Other intervertebral disc degeneration, lumbar region: Secondary | ICD-10-CM | POA: Diagnosis not present

## 2018-11-24 DIAGNOSIS — M179 Osteoarthritis of knee, unspecified: Secondary | ICD-10-CM | POA: Diagnosis not present

## 2018-11-24 DIAGNOSIS — M545 Low back pain: Secondary | ICD-10-CM | POA: Diagnosis not present

## 2018-11-28 ENCOUNTER — Other Ambulatory Visit: Payer: Self-pay | Admitting: *Deleted

## 2018-11-28 DIAGNOSIS — I25708 Atherosclerosis of coronary artery bypass graft(s), unspecified, with other forms of angina pectoris: Secondary | ICD-10-CM

## 2018-11-28 DIAGNOSIS — I10 Essential (primary) hypertension: Secondary | ICD-10-CM

## 2018-11-29 MED ORDER — METOPROLOL SUCCINATE ER 25 MG PO TB24: 25.0000 mg | ORAL_TABLET | Freq: Every day | ORAL | 3 refills | Status: DC

## 2018-11-29 NOTE — Telephone Encounter (Signed)
Refilled patient prescription for metoprolol and switched from tartrate to succinate. Called to inform patient but home and cell were no answer and could not leave voicemail. Asked pharmacist to inform patient.  Will try to set up f/u apt with patient to monitor BP/HR response

## 2018-12-02 ENCOUNTER — Other Ambulatory Visit: Payer: Self-pay | Admitting: *Deleted

## 2018-12-02 DIAGNOSIS — I25708 Atherosclerosis of coronary artery bypass graft(s), unspecified, with other forms of angina pectoris: Secondary | ICD-10-CM

## 2018-12-02 DIAGNOSIS — M5136 Other intervertebral disc degeneration, lumbar region: Secondary | ICD-10-CM | POA: Diagnosis not present

## 2018-12-02 DIAGNOSIS — M545 Low back pain: Secondary | ICD-10-CM | POA: Diagnosis not present

## 2018-12-02 DIAGNOSIS — M179 Osteoarthritis of knee, unspecified: Secondary | ICD-10-CM | POA: Diagnosis not present

## 2018-12-04 MED ORDER — METOPROLOL TARTRATE 25 MG PO TABS: 25.0000 mg | ORAL_TABLET | Freq: Two times a day (BID) | ORAL | 0 refills | Status: DC

## 2018-12-22 DIAGNOSIS — M5136 Other intervertebral disc degeneration, lumbar region: Secondary | ICD-10-CM | POA: Diagnosis not present

## 2018-12-22 DIAGNOSIS — M545 Low back pain: Secondary | ICD-10-CM | POA: Diagnosis not present

## 2018-12-22 DIAGNOSIS — M179 Osteoarthritis of knee, unspecified: Secondary | ICD-10-CM | POA: Diagnosis not present

## 2019-01-15 ENCOUNTER — Other Ambulatory Visit: Payer: Self-pay | Admitting: Student in an Organized Health Care Education/Training Program

## 2019-01-15 DIAGNOSIS — F411 Generalized anxiety disorder: Secondary | ICD-10-CM

## 2019-01-17 ENCOUNTER — Other Ambulatory Visit: Payer: Self-pay

## 2019-01-17 ENCOUNTER — Emergency Department (HOSPITAL_COMMUNITY)
Admission: EM | Admit: 2019-01-17 | Discharge: 2019-01-17 | Disposition: A | Discharge status: Home / Self Care | Payer: Medicaid Other | Attending: Emergency Medicine | Admitting: Emergency Medicine

## 2019-01-17 ENCOUNTER — Encounter (HOSPITAL_COMMUNITY): Payer: Self-pay | Admitting: Emergency Medicine

## 2019-01-17 ENCOUNTER — Emergency Department (HOSPITAL_COMMUNITY): Payer: Medicaid Other

## 2019-01-17 DIAGNOSIS — Y92410 Unspecified street and highway as the place of occurrence of the external cause: Secondary | ICD-10-CM | POA: Insufficient documentation

## 2019-01-17 DIAGNOSIS — Y9389 Activity, other specified: Secondary | ICD-10-CM | POA: Diagnosis not present

## 2019-01-17 DIAGNOSIS — Z79899 Other long term (current) drug therapy: Secondary | ICD-10-CM | POA: Diagnosis not present

## 2019-01-17 DIAGNOSIS — Z7982 Long term (current) use of aspirin: Secondary | ICD-10-CM | POA: Insufficient documentation

## 2019-01-17 DIAGNOSIS — F17290 Nicotine dependence, other tobacco product, uncomplicated: Secondary | ICD-10-CM | POA: Insufficient documentation

## 2019-01-17 DIAGNOSIS — I2510 Atherosclerotic heart disease of native coronary artery without angina pectoris: Secondary | ICD-10-CM | POA: Diagnosis not present

## 2019-01-17 DIAGNOSIS — S161XXA Strain of muscle, fascia and tendon at neck level, initial encounter: Secondary | ICD-10-CM | POA: Diagnosis not present

## 2019-01-17 DIAGNOSIS — I10 Essential (primary) hypertension: Secondary | ICD-10-CM | POA: Insufficient documentation

## 2019-01-17 DIAGNOSIS — Y998 Other external cause status: Secondary | ICD-10-CM | POA: Insufficient documentation

## 2019-01-17 DIAGNOSIS — S301XXA Contusion of abdominal wall, initial encounter: Secondary | ICD-10-CM | POA: Insufficient documentation

## 2019-01-17 DIAGNOSIS — S3991XA Unspecified injury of abdomen, initial encounter: Secondary | ICD-10-CM | POA: Diagnosis not present

## 2019-01-17 DIAGNOSIS — S299XXA Unspecified injury of thorax, initial encounter: Secondary | ICD-10-CM | POA: Diagnosis not present

## 2019-01-17 DIAGNOSIS — S060X1A Concussion with loss of consciousness of 30 minutes or less, initial encounter: Secondary | ICD-10-CM | POA: Diagnosis not present

## 2019-01-17 DIAGNOSIS — I672 Cerebral atherosclerosis: Secondary | ICD-10-CM | POA: Diagnosis not present

## 2019-01-17 DIAGNOSIS — S20212A Contusion of left front wall of thorax, initial encounter: Secondary | ICD-10-CM | POA: Insufficient documentation

## 2019-01-17 DIAGNOSIS — J3489 Other specified disorders of nose and nasal sinuses: Secondary | ICD-10-CM | POA: Diagnosis not present

## 2019-01-17 DIAGNOSIS — S199XXA Unspecified injury of neck, initial encounter: Secondary | ICD-10-CM | POA: Diagnosis present

## 2019-01-17 DIAGNOSIS — S0990XA Unspecified injury of head, initial encounter: Secondary | ICD-10-CM | POA: Diagnosis not present

## 2019-01-17 LAB — BASIC METABOLIC PANEL
Anion gap: 11 (ref 5–15)
BUN: 9 mg/dL (ref 6–20)
CO2: 21 mmol/L — ABNORMAL LOW (ref 22–32)
Calcium: 9.1 mg/dL (ref 8.9–10.3)
Chloride: 105 mmol/L (ref 98–111)
Creatinine, Ser: 1.3 mg/dL — ABNORMAL HIGH (ref 0.61–1.24)
GFR calc Af Amer: >60 mL/min (ref >60)
GFR calc non Af Amer: >60 mL/min (ref >60)
Glucose, Bld: 87 mg/dL (ref 70–99)
Potassium: 4 mmol/L (ref 3.5–5.1)
Sodium: 137 mmol/L (ref 135–145)

## 2019-01-17 LAB — CBC
HCT: 43.5 % (ref 39.0–52.0)
Hemoglobin: 14.7 g/dL (ref 13.0–17.0)
MCH: 29.8 pg (ref 26.0–34.0)
MCHC: 33.8 g/dL (ref 30.0–36.0)
MCV: 88.1 fL (ref 80.0–100.0)
Platelets: 211 10*3/uL (ref 150–400)
RBC: 4.94 MIL/uL (ref 4.22–5.81)
RDW: 12 % (ref 11.5–15.5)
WBC: 13.7 10*3/uL — ABNORMAL HIGH (ref 4.0–10.5)
nRBC: 0 % (ref 0.0–0.2)

## 2019-01-17 LAB — I-STAT CREATININE, ED: Creatinine, Ser: 1.2 mg/dL (ref 0.61–1.24)

## 2019-01-17 MED ORDER — OXYCODONE-ACETAMINOPHEN 5-325 MG PO TABS
2.0000 | ORAL_TABLET | Freq: Once | ORAL | Status: AC
  Administered 2019-01-17: 2 via ORAL
  Filled 2019-01-17: qty 2

## 2019-01-17 MED ORDER — METHOCARBAMOL 500 MG PO TABS: 500.0000 mg | ORAL_TABLET | Freq: Two times a day (BID) | ORAL | 0 refills | Status: DC

## 2019-01-17 MED ORDER — IOHEXOL 300 MG/ML  SOLN
100.0000 mL | Freq: Once | INTRAMUSCULAR | Status: AC | PRN
  Administered 2019-01-17: 100 mL via INTRAVENOUS
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
## 2019-01-17 NOTE — Discharge Instructions (Addendum)
On your CAT scan today you were found to have a 4 cm aneurysm of your aorta.  It just needs to be rechecked with an ultrasound in 1 year

## 2019-01-17 NOTE — ED Notes (Signed)
Patient verbalizes understanding of discharge instructions. Opportunity for questioning and answers were provided. Armband removed by staff, pt discharged from ED ambulatory w/ daughter  

## 2019-01-19 DIAGNOSIS — M179 Osteoarthritis of knee, unspecified: Secondary | ICD-10-CM | POA: Diagnosis not present

## 2019-01-19 DIAGNOSIS — M5136 Other intervertebral disc degeneration, lumbar region: Secondary | ICD-10-CM | POA: Diagnosis not present

## 2019-01-19 DIAGNOSIS — M545 Low back pain: Secondary | ICD-10-CM | POA: Diagnosis not present

## 2019-01-20 ENCOUNTER — Other Ambulatory Visit: Payer: Self-pay

## 2019-01-20 ENCOUNTER — Encounter: Payer: Self-pay | Admitting: Family Medicine

## 2019-01-20 ENCOUNTER — Ambulatory Visit (INDEPENDENT_AMBULATORY_CARE_PROVIDER_SITE_OTHER): Payer: Medicaid Other | Admitting: Family Medicine

## 2019-01-20 VITALS — BP 118/64 | HR 64 | Wt 237.0 lb

## 2019-01-20 DIAGNOSIS — I714 Abdominal aortic aneurysm, without rupture, unspecified: Secondary | ICD-10-CM | POA: Insufficient documentation

## 2019-01-20 DIAGNOSIS — I252 Old myocardial infarction: Secondary | ICD-10-CM | POA: Diagnosis not present

## 2019-01-20 DIAGNOSIS — I10 Essential (primary) hypertension: Secondary | ICD-10-CM

## 2019-01-20 NOTE — Assessment & Plan Note (Addendum)
-   Continue percocet as perscribed -Continue to rest as needed. Resume daily activities with self-limiting precautions. - Follow up with PCP in 1 month
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
## 2019-01-20 NOTE — Assessment & Plan Note (Addendum)
-   4cm AAA found on CT imaging - Follow up in 1 year with [place] to monitor

## 2019-01-20 NOTE — Assessment & Plan Note (Signed)
-   Lipid Panel   

## 2019-01-20 NOTE — Patient Instructions (Addendum)
Bring all of your medications to your next appointment  Follow up in 1 month.  Please call us and let us know which metoprolol you are taking---we expect a call from you today--- [phone]   It was wonderful to see you today.  Thank you for choosing [facility].   Please call [phone] with any questions about today's appointment.  Please be sure to schedule follow up at the front  desk before you leave today.   [place]

## 2019-01-20 NOTE — Assessment & Plan Note (Deleted)
-  BMP -Lipid panel

## 2019-01-20 NOTE — Assessment & Plan Note (Addendum)
BMP

## 2019-01-21 LAB — BASIC METABOLIC PANEL
BUN/Creatinine Ratio: 7 — ABNORMAL LOW (ref 9–20)
BUN: 8 mg/dL (ref 6–24)
CO2: 21 mmol/L (ref 20–29)
Calcium: 9.3 mg/dL (ref 8.7–10.2)
Chloride: 101 mmol/L (ref 96–106)
Creatinine, Ser: 1.17 mg/dL (ref 0.76–1.27)
GFR calc Af Amer: 81 mL/min/{1.73_m2} (ref >59)
GFR calc non Af Amer: 70 mL/min/{1.73_m2} (ref >59)
Glucose: 78 mg/dL (ref 65–99)
Potassium: 4.5 mmol/L (ref 3.5–5.2)
Sodium: 138 mmol/L (ref 134–144)

## 2019-01-21 LAB — LIPID PANEL
Chol/HDL Ratio: 3.1 ratio (ref 0.0–5.0)
Cholesterol, Total: 107 mg/dL (ref 100–199)
HDL: 35 mg/dL — ABNORMAL LOW (ref >39)
LDL Calculated: 39 mg/dL (ref 0–99)
Triglycerides: 165 mg/dL — ABNORMAL HIGH (ref 0–149)
VLDL Cholesterol Cal: 33 mg/dL (ref 5–40)

## 2019-01-23 ENCOUNTER — Telehealth: Payer: Self-pay | Admitting: *Deleted
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
## 2019-01-27 ENCOUNTER — Encounter: Payer: Self-pay | Admitting: Family Medicine

## 2019-01-27 NOTE — Progress Notes (Signed)
Red team nursing please fax letter to patient's preferred pain management doctor. Thank you!  George Vita Autry-Lott, DO

## 2019-01-27 NOTE — Progress Notes (Signed)
Results are normal. Please follow up with PCP in 6 months.

## 2019-02-18 DIAGNOSIS — M5136 Other intervertebral disc degeneration, lumbar region: Secondary | ICD-10-CM | POA: Diagnosis not present

## 2019-02-18 DIAGNOSIS — M545 Low back pain: Secondary | ICD-10-CM | POA: Diagnosis not present

## 2019-02-18 DIAGNOSIS — M48062 Spinal stenosis, lumbar region with neurogenic claudication: Secondary | ICD-10-CM | POA: Diagnosis not present

## 2019-02-18 DIAGNOSIS — I10 Essential (primary) hypertension: Secondary | ICD-10-CM | POA: Diagnosis not present

## 2019-02-18 DIAGNOSIS — M179 Osteoarthritis of knee, unspecified: Secondary | ICD-10-CM | POA: Diagnosis not present

## 2019-03-19 DIAGNOSIS — M545 Low back pain: Secondary | ICD-10-CM | POA: Diagnosis not present

## 2019-03-19 DIAGNOSIS — M5136 Other intervertebral disc degeneration, lumbar region: Secondary | ICD-10-CM | POA: Diagnosis not present

## 2019-03-19 DIAGNOSIS — M179 Osteoarthritis of knee, unspecified: Secondary | ICD-10-CM | POA: Diagnosis not present

## 2019-03-23 ENCOUNTER — Other Ambulatory Visit: Payer: Self-pay | Admitting: Student in an Organized Health Care Education/Training Program

## 2019-03-23 DIAGNOSIS — I2510 Atherosclerotic heart disease of native coronary artery without angina pectoris: Secondary | ICD-10-CM

## 2019-03-23 DIAGNOSIS — I25708 Atherosclerosis of coronary artery bypass graft(s), unspecified, with other forms of angina pectoris: Secondary | ICD-10-CM

## 2019-03-24 NOTE — Telephone Encounter (Signed)
Hello, I refilled this prescription yesterday. Do you know why I'm getting another request for refill? This has happened with several of my Rx requests recently.

## 2019-04-15 DIAGNOSIS — M179 Osteoarthritis of knee, unspecified: Secondary | ICD-10-CM | POA: Diagnosis not present

## 2019-04-15 DIAGNOSIS — M5136 Other intervertebral disc degeneration, lumbar region: Secondary | ICD-10-CM | POA: Diagnosis not present

## 2019-04-15 DIAGNOSIS — M545 Low back pain: Secondary | ICD-10-CM | POA: Diagnosis not present

## 2019-05-13 DIAGNOSIS — M179 Osteoarthritis of knee, unspecified: Secondary | ICD-10-CM | POA: Diagnosis not present

## 2019-05-13 DIAGNOSIS — M545 Low back pain: Secondary | ICD-10-CM | POA: Diagnosis not present

## 2019-05-13 DIAGNOSIS — M5136 Other intervertebral disc degeneration, lumbar region: Secondary | ICD-10-CM | POA: Diagnosis not present

## 2019-05-15 ENCOUNTER — Other Ambulatory Visit: Payer: Self-pay | Admitting: Student in an Organized Health Care Education/Training Program

## 2019-05-15 DIAGNOSIS — F411 Generalized anxiety disorder: Secondary | ICD-10-CM

## 2019-05-15 DIAGNOSIS — I25708 Atherosclerosis of coronary artery bypass graft(s), unspecified, with other forms of angina pectoris: Secondary | ICD-10-CM

## 2019-05-29 ENCOUNTER — Other Ambulatory Visit: Payer: Self-pay | Admitting: Student in an Organized Health Care Education/Training Program

## 2019-06-01 ENCOUNTER — Other Ambulatory Visit: Payer: Self-pay | Admitting: Student in an Organized Health Care Education/Training Program

## 2019-06-01 DIAGNOSIS — F411 Generalized anxiety disorder: Secondary | ICD-10-CM

## 2019-06-02 NOTE — Telephone Encounter (Signed)
2nd pharmacy request.  Everli Rother,CMA  

## 2019-06-04 ENCOUNTER — Other Ambulatory Visit: Payer: Self-pay | Admitting: Student in an Organized Health Care Education/Training Program

## 2019-06-04 DIAGNOSIS — F411 Generalized anxiety disorder: Secondary | ICD-10-CM

## 2019-06-16 DIAGNOSIS — M545 Low back pain: Secondary | ICD-10-CM | POA: Diagnosis not present

## 2019-06-16 DIAGNOSIS — M179 Osteoarthritis of knee, unspecified: Secondary | ICD-10-CM | POA: Diagnosis not present

## 2019-06-16 DIAGNOSIS — M5136 Other intervertebral disc degeneration, lumbar region: Secondary | ICD-10-CM | POA: Diagnosis not present

## 2019-07-07 ENCOUNTER — Other Ambulatory Visit: Payer: Self-pay

## 2019-07-07 ENCOUNTER — Ambulatory Visit (INDEPENDENT_AMBULATORY_CARE_PROVIDER_SITE_OTHER): Payer: Medicaid Other | Admitting: Student in an Organized Health Care Education/Training Program

## 2019-07-07 VITALS — BP 147/72 | HR 62

## 2019-07-07 DIAGNOSIS — S8991XA Unspecified injury of right lower leg, initial encounter: Secondary | ICD-10-CM

## 2019-07-07 NOTE — Assessment & Plan Note (Signed)
Exam consistent with acute soft tissue injury without ligamental instability or osseous abnormalities.  Provided patient with compression sleeve and recommendation to use stabilizing brace at home as he has pain with manipulation of the knee.  Do not recommend NSAIDs with patient's heart conditions so also recommended to elevate and continue to ice.  Refer to ortho- has been seen at [facility] many years ago for same knee.  If not improving or able to get into ortho soon, could consider aspirating the cyst for symptomatic relief.

## 2019-07-07 NOTE — Patient Instructions (Signed)
It was a pleasure to see you today!  To summarize our discussion for this visit:  For the swelling of your knee, I would recommend rest, elevation, compression, and ice.   I have placed an ortho referral to be seen for this issue.    Please return to our clinic to see me as needed.  Call the clinic at [phone] if your symptoms worsen or you have any concerns.   Thank you for allowing me to take part in your care,  Dr. Doristine Mango

## 2019-07-07 NOTE — Progress Notes (Signed)
   Subjective:    Patient ID: George [name], male    DOB: [birth year], 53 y.o.   MRN: [id number]  CC: Right knee pain  HPI:  2 days of right knee pain initiated by twisting knee motion when foot slid in mud while walking. Did not hear/feel a pop. He felt a tear which initiated at his posterior knee and radiated down towards his foot.  Since onset of pain patient has had difficulty with walking and movement of his knee and foot secondary to pain.  He has chronic swelling of his knees but endorses that his right knee is more swollen than usual.  Denies any bleeding, rash, fever.  He has not tried compression.  He has tried ice which has not helped the swelling.  States that this feels similar to previous injury which resulted in surgery in 1990 on the same knee.  Smoking status reviewed   ROS: pertinent noted in the HPI   I have personally reviewed pertinent past medical history, surgical, family, and social history as appropriate. Objective:  BP (!) 147/72   Pulse 62   SpO2 97%   Vitals and nursing note reviewed  General:uncomfortable, able to participate in exam Extremities: R knee: significant bakers cyst, negative for bruising or skin changes, negative joint line tenderness, negative patellar grind test, negative McMurray's, negative drawer tests, negative valgus and varus stress.  Positive for tenderness to mid tibial shaft without laxity or crepitus.  Decreased strength and range of motion with both patellar flexion and extension as well as foot plantar and dorsiflexion.  Circulation and sensation intact distally. L knee: Joint has chronic swelling but is not changed from baseline per patient and is not tender to palpation.  Strength, range of motion, sensation, circulation intact throughout lower extremity. Skin: warm and dry, no rashes noted Neuro: alert, no obvious focal deficits Psych: Normal affect and mood  Assessment & Plan:   Right knee injury, initial encounter Exam  consistent with acute soft tissue injury without ligamental instability or osseous abnormalities.  Provided patient with compression sleeve and recommendation to use stabilizing brace at home as he has pain with manipulation of the knee.  Do not recommend NSAIDs with patient's heart conditions so also recommended to elevate and continue to ice.  Refer to ortho- has been seen at [facility] many years ago for same knee.  If not improving or able to get into ortho soon, could consider aspirating the cyst for symptomatic relief.   Orders Placed This Encounter  Procedures  . Ambulatory referral to Orthopedic Surgery    Referral Priority:   Routine    Referral Type:   Surgical    Referral Reason:   Specialty Services Required    Requested Specialty:   Orthopedic Surgery    Number of Visits Requested:   [place], [place] [facility] PGY-2

## 2019-07-13 DIAGNOSIS — M25561 Pain in right knee: Secondary | ICD-10-CM | POA: Diagnosis not present

## 2019-07-14 DIAGNOSIS — G894 Chronic pain syndrome: Secondary | ICD-10-CM | POA: Diagnosis not present

## 2019-07-14 DIAGNOSIS — M545 Low back pain: Secondary | ICD-10-CM | POA: Diagnosis not present

## 2019-07-14 DIAGNOSIS — M179 Osteoarthritis of knee, unspecified: Secondary | ICD-10-CM | POA: Diagnosis not present

## 2019-07-14 DIAGNOSIS — M5136 Other intervertebral disc degeneration, lumbar region: Secondary | ICD-10-CM | POA: Diagnosis not present

## 2019-08-09 ENCOUNTER — Other Ambulatory Visit: Payer: Self-pay | Admitting: Student in an Organized Health Care Education/Training Program

## 2019-08-09 DIAGNOSIS — F411 Generalized anxiety disorder: Secondary | ICD-10-CM

## 2019-08-09 DIAGNOSIS — I25708 Atherosclerosis of coronary artery bypass graft(s), unspecified, with other forms of angina pectoris: Secondary | ICD-10-CM

## 2019-08-11 DIAGNOSIS — G894 Chronic pain syndrome: Secondary | ICD-10-CM | POA: Diagnosis not present

## 2019-08-11 DIAGNOSIS — M5136 Other intervertebral disc degeneration, lumbar region: Secondary | ICD-10-CM | POA: Diagnosis not present

## 2019-08-11 DIAGNOSIS — M545 Low back pain: Secondary | ICD-10-CM | POA: Diagnosis not present

## 2019-09-04 ENCOUNTER — Other Ambulatory Visit: Payer: Self-pay | Admitting: Student in an Organized Health Care Education/Training Program

## 2019-09-16 DIAGNOSIS — G894 Chronic pain syndrome: Secondary | ICD-10-CM | POA: Diagnosis not present

## 2019-09-16 DIAGNOSIS — M545 Low back pain: Secondary | ICD-10-CM | POA: Diagnosis not present

## 2019-09-28 ENCOUNTER — Other Ambulatory Visit: Payer: Self-pay | Admitting: Student in an Organized Health Care Education/Training Program

## 2019-09-28 ENCOUNTER — Encounter: Payer: Self-pay | Admitting: Student in an Organized Health Care Education/Training Program

## 2019-09-28 DIAGNOSIS — F411 Generalized anxiety disorder: Secondary | ICD-10-CM

## 2019-10-14 DIAGNOSIS — M5136 Other intervertebral disc degeneration, lumbar region: Secondary | ICD-10-CM | POA: Diagnosis not present

## 2019-10-14 DIAGNOSIS — G894 Chronic pain syndrome: Secondary | ICD-10-CM | POA: Diagnosis not present

## 2019-10-14 DIAGNOSIS — M545 Low back pain: Secondary | ICD-10-CM | POA: Diagnosis not present

## 2019-11-11 DIAGNOSIS — M545 Low back pain: Secondary | ICD-10-CM | POA: Diagnosis not present

## 2019-11-11 DIAGNOSIS — G894 Chronic pain syndrome: Secondary | ICD-10-CM | POA: Diagnosis not present

## 2019-12-08 ENCOUNTER — Other Ambulatory Visit: Payer: Self-pay | Admitting: Student in an Organized Health Care Education/Training Program

## 2019-12-08 DIAGNOSIS — F411 Generalized anxiety disorder: Secondary | ICD-10-CM

## 2019-12-10 DIAGNOSIS — G894 Chronic pain syndrome: Secondary | ICD-10-CM | POA: Diagnosis not present

## 2019-12-10 DIAGNOSIS — M545 Low back pain: Secondary | ICD-10-CM | POA: Diagnosis not present

## 2019-12-11 ENCOUNTER — Other Ambulatory Visit: Payer: Self-pay

## 2019-12-14 ENCOUNTER — Other Ambulatory Visit: Payer: Self-pay | Admitting: *Deleted

## 2019-12-14 DIAGNOSIS — I2510 Atherosclerotic heart disease of native coronary artery without angina pectoris: Secondary | ICD-10-CM

## 2019-12-14 DIAGNOSIS — I25708 Atherosclerosis of coronary artery bypass graft(s), unspecified, with other forms of angina pectoris: Secondary | ICD-10-CM

## 2019-12-14 DIAGNOSIS — F411 Generalized anxiety disorder: Secondary | ICD-10-CM

## 2019-12-14 DIAGNOSIS — I10 Essential (primary) hypertension: Secondary | ICD-10-CM

## 2019-12-14 MED ORDER — METOPROLOL SUCCINATE ER 25 MG PO TB24: 25.0000 mg | ORAL_TABLET | Freq: Every day | ORAL | 0 refills | Status: DC

## 2019-12-14 MED ORDER — ROSUVASTATIN CALCIUM 40 MG PO TABS: 40.0000 mg | ORAL_TABLET | Freq: Every day | ORAL | 0 refills | Status: DC

## 2019-12-14 NOTE — Telephone Encounter (Signed)
Patient calls nurse line very frustrated that none of his requested medications have been refilled. Per chart review, pharmacy sent in medication requests as far back as June 1st, with no response from provider. I advised him looks like he does need an apt, this has been scheduled with PCP for July. Please advise on medications. He has been out of his blood pressure medication ~ 1 week.

## 2019-12-14 NOTE — Telephone Encounter (Signed)
Pt has appt with pcp already. George [name] Bruna Potter, CMA

## 2019-12-14 NOTE — Telephone Encounter (Signed)
Please ask patient to schedule a follow up visit with PCP Thanks [name] [name], MD  

## 2019-12-14 NOTE — Telephone Encounter (Signed)
I sent in rx for all 4 medications (2 of them in a separate refill encounter). Latrelle Dodrill, MD

## 2020-02-01 ENCOUNTER — Other Ambulatory Visit: Payer: Self-pay

## 2020-02-01 ENCOUNTER — Encounter (HOSPITAL_COMMUNITY): Payer: Self-pay | Admitting: Emergency Medicine

## 2020-02-01 ENCOUNTER — Emergency Department (HOSPITAL_COMMUNITY)
Admission: EM | Admit: 2020-02-01 | Discharge: 2020-02-01 | Disposition: A | Discharge status: Home / Self Care | Payer: Medicaid Other | Attending: Emergency Medicine | Admitting: Emergency Medicine

## 2020-02-01 DIAGNOSIS — F17210 Nicotine dependence, cigarettes, uncomplicated: Secondary | ICD-10-CM | POA: Insufficient documentation

## 2020-02-01 DIAGNOSIS — Z79899 Other long term (current) drug therapy: Secondary | ICD-10-CM | POA: Insufficient documentation

## 2020-02-01 DIAGNOSIS — Z7982 Long term (current) use of aspirin: Secondary | ICD-10-CM | POA: Diagnosis not present

## 2020-02-01 DIAGNOSIS — Z955 Presence of coronary angioplasty implant and graft: Secondary | ICD-10-CM | POA: Insufficient documentation

## 2020-02-01 DIAGNOSIS — K0889 Other specified disorders of teeth and supporting structures: Secondary | ICD-10-CM | POA: Insufficient documentation

## 2020-02-01 DIAGNOSIS — I119 Hypertensive heart disease without heart failure: Secondary | ICD-10-CM | POA: Insufficient documentation

## 2020-02-01 MED ORDER — CLINDAMYCIN HCL 300 MG PO CAPS: 300.0000 mg | ORAL_CAPSULE | Freq: Four times a day (QID) | ORAL | 0 refills | Status: DC
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
## 2020-02-01 NOTE — Discharge Instructions (Addendum)
It appears that you have an infection associated with the large cavities of teeth on the right lower jaw.  Use heat on sore area 3-4 times a day to help the discomfort and swelling.  Call the oral surgeon listed, for an appointment to be seen for further care and treatment as soon as possible.

## 2020-02-01 NOTE — ED Notes (Signed)
Patient verbalizes understanding of discharge instructions. Opportunity for questioning and answers were provided. Armband removed by staff, pt discharged from ED to home 

## 2020-02-01 NOTE — ED Triage Notes (Signed)
Pt reports dental pain on the right side that started Friday evening.   He took some "left over antibiotics that have not worked."  "Pain is so bad, I'm drenched in sweat."  Some swelling is noted

## 2020-02-02 ENCOUNTER — Ambulatory Visit (INDEPENDENT_AMBULATORY_CARE_PROVIDER_SITE_OTHER): Payer: Medicaid Other | Admitting: Student in an Organized Health Care Education/Training Program

## 2020-02-02 ENCOUNTER — Telehealth: Payer: Self-pay | Admitting: *Deleted

## 2020-02-02 VITALS — BP 102/70 | HR 101 | Ht 71.0 in | Wt 238.2 lb

## 2020-02-02 DIAGNOSIS — I2510 Atherosclerotic heart disease of native coronary artery without angina pectoris: Secondary | ICD-10-CM | POA: Diagnosis not present

## 2020-02-02 DIAGNOSIS — F411 Generalized anxiety disorder: Secondary | ICD-10-CM

## 2020-02-02 DIAGNOSIS — I10 Essential (primary) hypertension: Secondary | ICD-10-CM | POA: Diagnosis not present

## 2020-02-02 DIAGNOSIS — K047 Periapical abscess without sinus: Secondary | ICD-10-CM

## 2020-02-02 DIAGNOSIS — I714 Abdominal aortic aneurysm, without rupture, unspecified: Secondary | ICD-10-CM

## 2020-02-02 DIAGNOSIS — R61 Generalized hyperhidrosis: Secondary | ICD-10-CM

## 2020-02-02 DIAGNOSIS — R252 Cramp and spasm: Secondary | ICD-10-CM | POA: Diagnosis not present

## 2020-02-02 DIAGNOSIS — G8929 Other chronic pain: Secondary | ICD-10-CM

## 2020-02-02 DIAGNOSIS — M545 Low back pain, unspecified: Secondary | ICD-10-CM

## 2020-02-02 DIAGNOSIS — F17200 Nicotine dependence, unspecified, uncomplicated: Secondary | ICD-10-CM

## 2020-02-02 DIAGNOSIS — I25708 Atherosclerosis of coronary artery bypass graft(s), unspecified, with other forms of angina pectoris: Secondary | ICD-10-CM | POA: Diagnosis not present

## 2020-02-02 MED ORDER — SIMETHICONE 62.5 MG PO STRP: 62.5000 mg | ORAL_STRIP | Freq: Once | ORAL | 0 refills | Status: DC | PRN

## 2020-02-02 MED ORDER — ROSUVASTATIN CALCIUM 40 MG PO TABS: 40.0000 mg | ORAL_TABLET | Freq: Every day | ORAL | 1 refills | Status: DC

## 2020-02-02 MED ORDER — METOPROLOL SUCCINATE ER 25 MG PO TB24: 12.5000 mg | ORAL_TABLET | Freq: Every day | ORAL | 1 refills | Status: DC

## 2020-02-02 MED ORDER — HYDROXYZINE HCL 25 MG PO TABS: 25.0000 mg | ORAL_TABLET | Freq: Two times a day (BID) | ORAL | 2 refills | Status: DC | PRN

## 2020-02-02 MED ORDER — LISINOPRIL 5 MG PO TABS: 5.0000 mg | ORAL_TABLET | Freq: Every day | ORAL | 1 refills | Status: DC

## 2020-02-02 MED ORDER — NITROGLYCERIN 0.4 MG SL SUBL: 0.4000 mg | SUBLINGUAL_TABLET | SUBLINGUAL | 1 refills | Status: AC | PRN

## 2020-02-02 MED ORDER — BACLOFEN 20 MG PO TABS: 20.0000 mg | ORAL_TABLET | Freq: Three times a day (TID) | ORAL | 0 refills | Status: DC

## 2020-02-02 MED ORDER — ASPIRIN 81 MG PO CHEW: 81.0000 mg | CHEWABLE_TABLET | Freq: Every day | ORAL | 1 refills | Status: DC

## 2020-02-02 MED ORDER — ISOSORBIDE MONONITRATE ER 30 MG PO TB24: 30.0000 mg | ORAL_TABLET | Freq: Every day | ORAL | 1 refills | Status: DC

## 2020-02-02 NOTE — Assessment & Plan Note (Signed)
Extensive spinal pathology -Strongly recommended physical therapy to patient but he declined -Prescribed baclofen -Obtaining labs to check electrolytes, thyroid, CBC per request of his pain management doctor We will follow-up with patient when received results of imaging and labs

## 2020-02-02 NOTE — Telephone Encounter (Signed)
Medicaid Managed Care team Transition of Care Assessment outreach attempt #1 made today. Unable to reach patient. HIPPA compliant voice message left requesting a return call. The patient has also been enrolled in an automated discharge follow up call series and will receive two outreach attempts for transition of care assessment. Contact information has been left for the patient and the Medicaid Managed Care team is available to provide assistance to the patient at any time.  ° °Katrice Laveyah Oriol, RN, BSN, CCRN °Patient Engagement Center °[phone] ° °

## 2020-02-02 NOTE — Assessment & Plan Note (Signed)
Palpated on exam today -Scheduled abdominal ultrasound for this week -Recommended that patient follow-up with his cardiologist

## 2020-02-02 NOTE — Assessment & Plan Note (Signed)
Smokes 1.5 to 2 packs of cigarettes per day Offered assistance for cessation but patient declined at this time Reassess at next visit
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
## 2020-02-02 NOTE — Patient Instructions (Signed)
It was a pleasure to see you today!  To summarize our discussion for this visit:  I have ordered you an ultrasound of your abdominal aorta for Friday at 9am at [facility]. We will discuss the results of this imaging and your labs at the same time  Today, I am refilling your medications with the following changes:  Adding on baclofen (muscle relaxant)  Increasing atarax to twice per day as needed. We can discuss at a separate appointment if you need more help with managing anxiety and/or depression  I highly recommend that you follow up with a cardiologist  You have several health maintenance measures we should talk about at your next visit.  Some additional health maintenance measures we should update are: Health Maintenance Due  Topic Date Due  . Hepatitis C Screening  Never done  . COVID-19 Vaccine (1) Never done  . HIV Screening  Never done  . TETANUS/TDAP  Never done  . COLONOSCOPY  Never done  .   Please return to our clinic to see me in about 4 months.  Call the clinic at [phone] if your symptoms worsen or you have any concerns.   Thank you for allowing me to take part in your care,  Dr. Jamelle Rushing

## 2020-02-02 NOTE — Assessment & Plan Note (Signed)
Unchanged.  PHQ-9 score of 7 Denies SI Continue Atarax twice daily as needed Patient is resistant to controlling medications for anxiety/depression but will readdress at next visit.

## 2020-02-02 NOTE — Assessment & Plan Note (Signed)
-  Continue clindamycin prescribed by ED -Follow-up with dentist

## 2020-02-03 DIAGNOSIS — M179 Osteoarthritis of knee, unspecified: Secondary | ICD-10-CM | POA: Diagnosis not present

## 2020-02-03 DIAGNOSIS — G894 Chronic pain syndrome: Secondary | ICD-10-CM | POA: Diagnosis not present

## 2020-02-03 DIAGNOSIS — M5136 Other intervertebral disc degeneration, lumbar region: Secondary | ICD-10-CM | POA: Diagnosis not present

## 2020-02-03 DIAGNOSIS — M545 Low back pain: Secondary | ICD-10-CM | POA: Diagnosis not present

## 2020-02-03 LAB — BASIC METABOLIC PANEL
BUN/Creatinine Ratio: 9 (ref 9–20)
BUN: 10 mg/dL (ref 6–24)
CO2: 22 mmol/L (ref 20–29)
Calcium: 9.6 mg/dL (ref 8.7–10.2)
Chloride: 99 mmol/L (ref 96–106)
Creatinine, Ser: 1.07 mg/dL (ref 0.76–1.27)
GFR calc Af Amer: 89 mL/min/{1.73_m2} (ref >59)
GFR calc non Af Amer: 77 mL/min/{1.73_m2} (ref >59)
Glucose: 101 mg/dL — ABNORMAL HIGH (ref 65–99)
Potassium: 4 mmol/L (ref 3.5–5.2)
Sodium: 137 mmol/L (ref 134–144)

## 2020-02-03 LAB — CBC
Hematocrit: 44.6 % (ref 37.5–51.0)
Hemoglobin: 14.9 g/dL (ref 13.0–17.7)
MCH: 29.4 pg (ref 26.6–33.0)
MCHC: 33.4 g/dL (ref 31.5–35.7)
MCV: 88 fL (ref 79–97)
Platelets: 195 10*3/uL (ref 150–450)
RBC: 5.06 x10E6/uL (ref 4.14–5.80)
RDW: 12.5 % (ref 11.6–15.4)
WBC: 8.5 10*3/uL (ref 3.4–10.8)

## 2020-02-03 LAB — TSH: TSH: 3.49 u[IU]/mL (ref 0.450–4.500)

## 2020-02-05 ENCOUNTER — Ambulatory Visit (HOSPITAL_COMMUNITY): Payer: Medicaid Other

## 2020-02-08 ENCOUNTER — Other Ambulatory Visit: Payer: Self-pay | Admitting: Student in an Organized Health Care Education/Training Program

## 2020-02-08 ENCOUNTER — Other Ambulatory Visit: Payer: Self-pay | Admitting: Family Medicine

## 2020-02-08 DIAGNOSIS — F411 Generalized anxiety disorder: Secondary | ICD-10-CM

## 2020-02-08 DIAGNOSIS — I25708 Atherosclerosis of coronary artery bypass graft(s), unspecified, with other forms of angina pectoris: Secondary | ICD-10-CM

## 2020-02-10 ENCOUNTER — Other Ambulatory Visit: Payer: Self-pay

## 2020-02-10 ENCOUNTER — Ambulatory Visit (HOSPITAL_COMMUNITY)
Admission: RE | Admit: 2020-02-10 | Discharge: 2020-02-10 | Disposition: A | Discharge status: Home / Self Care | Payer: Medicaid Other | Source: Ambulatory Visit | Attending: Family Medicine | Admitting: Family Medicine

## 2020-02-10 DIAGNOSIS — I714 Abdominal aortic aneurysm, without rupture, unspecified: Secondary | ICD-10-CM

## 2020-05-02 DIAGNOSIS — G894 Chronic pain syndrome: Secondary | ICD-10-CM | POA: Diagnosis not present

## 2020-06-17 ENCOUNTER — Other Ambulatory Visit: Payer: Self-pay | Admitting: Student in an Organized Health Care Education/Training Program

## 2020-06-17 DIAGNOSIS — F411 Generalized anxiety disorder: Secondary | ICD-10-CM

## 2020-07-29 ENCOUNTER — Other Ambulatory Visit: Payer: Self-pay | Admitting: Student in an Organized Health Care Education/Training Program

## 2020-07-29 DIAGNOSIS — F411 Generalized anxiety disorder: Secondary | ICD-10-CM

## 2020-08-15 ENCOUNTER — Other Ambulatory Visit: Payer: Self-pay | Admitting: Student in an Organized Health Care Education/Training Program

## 2020-08-15 DIAGNOSIS — I25708 Atherosclerosis of coronary artery bypass graft(s), unspecified, with other forms of angina pectoris: Secondary | ICD-10-CM

## 2020-08-15 NOTE — Telephone Encounter (Signed)
Pt informed. Dash Cardarelli, CMA  
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
## 2020-09-21 ENCOUNTER — Other Ambulatory Visit: Payer: Self-pay | Admitting: Student in an Organized Health Care Education/Training Program

## 2020-09-21 DIAGNOSIS — I25708 Atherosclerosis of coronary artery bypass graft(s), unspecified, with other forms of angina pectoris: Secondary | ICD-10-CM

## 2020-09-21 DIAGNOSIS — I2510 Atherosclerotic heart disease of native coronary artery without angina pectoris: Secondary | ICD-10-CM

## 2020-09-21 DIAGNOSIS — F411 Generalized anxiety disorder: Secondary | ICD-10-CM

## 2020-09-22 ENCOUNTER — Other Ambulatory Visit: Payer: Self-pay

## 2020-09-22 DIAGNOSIS — F411 Generalized anxiety disorder: Secondary | ICD-10-CM

## 2020-09-23 ENCOUNTER — Other Ambulatory Visit: Payer: Self-pay

## 2020-10-13 ENCOUNTER — Ambulatory Visit (INDEPENDENT_AMBULATORY_CARE_PROVIDER_SITE_OTHER): Payer: Medicaid Other | Admitting: Student in an Organized Health Care Education/Training Program

## 2020-10-13 ENCOUNTER — Encounter: Payer: Self-pay | Admitting: Student in an Organized Health Care Education/Training Program

## 2020-10-13 ENCOUNTER — Other Ambulatory Visit: Payer: Self-pay

## 2020-10-13 VITALS — BP 120/65 | HR 63 | Ht 70.5 in | Wt 237.8 lb

## 2020-10-13 DIAGNOSIS — I714 Abdominal aortic aneurysm, without rupture, unspecified: Secondary | ICD-10-CM

## 2020-10-13 DIAGNOSIS — E669 Obesity, unspecified: Secondary | ICD-10-CM

## 2020-10-13 DIAGNOSIS — I2510 Atherosclerotic heart disease of native coronary artery without angina pectoris: Secondary | ICD-10-CM | POA: Diagnosis not present

## 2020-10-13 DIAGNOSIS — I10 Essential (primary) hypertension: Secondary | ICD-10-CM | POA: Diagnosis not present

## 2020-10-13 DIAGNOSIS — E7849 Other hyperlipidemia: Secondary | ICD-10-CM | POA: Diagnosis not present

## 2020-10-13 DIAGNOSIS — M545 Low back pain, unspecified: Secondary | ICD-10-CM

## 2020-10-13 DIAGNOSIS — G8929 Other chronic pain: Secondary | ICD-10-CM | POA: Diagnosis not present

## 2020-10-13 DIAGNOSIS — E785 Hyperlipidemia, unspecified: Secondary | ICD-10-CM | POA: Insufficient documentation

## 2020-10-13 LAB — POCT GLYCOSYLATED HEMOGLOBIN (HGB A1C): Hemoglobin A1C: 5.6 % (ref 4.0–5.6)

## 2020-10-13 MED ORDER — VENLAFAXINE HCL ER 37.5 MG PO CP24: 37.5000 mg | ORAL_CAPSULE | Freq: Every day | ORAL | 0 refills | Status: DC

## 2020-10-13 NOTE — Progress Notes (Signed)
   SUBJECTIVE:   CHIEF COMPLAINT / HPI:  Med refill  Chronic pain- seeing pain medicine clinic. They recommended muscle relaxer. Wanted him to get from PCP. They suggested he may have fibromyalgia in addition to arthritis and degenerative vertebral pathology. Was increased to 10mg  percocet daily. Also using Voltaren gel which helps some.  Not on bowel regimen. Has daily BMs. H/o hemorrhoids.   Caregiver stress- caring for both ill parents and wife. Has a "stress monitor" showing peaks of stress multiple times per day. Has been taking atarax BID regularly. Open to taking a controller medication.    Cardiology referral   OBJECTIVE:   BP 120/65   Pulse 63   Ht 5' 10.5" (1.791 m)   Wt 237 lb 12.8 oz (107.9 kg)   SpO2 97%   BMI 33.64 kg/m   Physical Exam Vitals and nursing note reviewed.  Constitutional:      General: He is not in acute distress.    Appearance: Normal appearance. He is obese. He is not toxic-appearing.  HENT:     Head: Normocephalic.  Pulmonary:     Effort: Pulmonary effort is normal.  Skin:    General: Skin is dry.     Capillary Refill: Capillary refill takes less than 2 seconds.  Neurological:     Mental Status: He is alert.    ASSESSMENT/PLAN:   Hyperlipemia Lipid panel today Continue rosuvastatin 20  BACK PAIN, LUMBAR Continue with pain management clinic Recommended daily bowel regiment in setting of increased opiate use including fiber daily Patient has baseline normal kidney function and requests muscle relaxer refill Repeat CMP today Refill baclofen Patient again declines recommendations for physical therapy at this time Starting venlafaxine today and attempt to help treat both pain and mood component of patient complaints. Follow-up in 4 weeks  CORONARY ATHEROSCLEROSIS NATIVE CORONARY ARTERY Referral placed for cardiology.  Patient states that he has withdrawn care from lobe our and [facility] unsatisfactory care and would like  referral to a different location at this time  Abdominal aortic aneurysm ([facility]) [facility]) Repeat imaging 7/22 Discuss at follow-up    8/22, DO [facility]

## 2020-10-13 NOTE — Assessment & Plan Note (Signed)
Repeat imaging 7/22 Discuss at follow-up

## 2020-10-13 NOTE — Assessment & Plan Note (Signed)
Lipid panel today Continue rosuvastatin 20

## 2020-10-13 NOTE — Patient Instructions (Signed)
It was a pleasure to see you today!  To summarize our discussion for this visit:  For your chronic pain and expressing stress/anxiety, I suggest we start a daily controller medication of venlafaxine. Please follow up with me in 4 weeks to monitor progress  I HIGHLY recommend daily bowel regimens with fiber supplements to help prevent constipation with your pain medications  We will check cholesterol and kidney function today  Please follow up with cardiology in very near future.  Some additional health maintenance measures we should update are: Health Maintenance Due  Topic Date Due  . Hepatitis C Screening  Never done  . COVID-19 Vaccine (1) Never done  . HIV Screening  Never done  . TETANUS/TDAP  Never done  . COLONOSCOPY (Pts 45-41yrs Insurance coverage will need to be confirmed)  Never done  .    Please return to our clinic to see me in about 4 weeks  Call the clinic at [phone] if your symptoms worsen or you have any concerns.   Thank you for allowing me to take part in your care,  Dr. Jamelle Rushing

## 2020-10-13 NOTE — Assessment & Plan Note (Signed)
Referral placed for cardiology.  Patient states that he has withdrawn care from lobe our and [facility] unsatisfactory care and would like referral to a different location at this time

## 2020-10-13 NOTE — Assessment & Plan Note (Signed)
Continue with pain management clinic Recommended daily bowel regiment in setting of increased opiate use including fiber daily Patient has baseline normal kidney function and requests muscle relaxer refill Repeat CMP today Refill baclofen Patient again declines recommendations for physical therapy at this time Starting venlafaxine today and attempt to help treat both pain and mood component of patient complaints. Follow-up in 4 weeks

## 2020-10-14 LAB — COMPREHENSIVE METABOLIC PANEL
ALT: 26 IU/L (ref 0–44)
AST: 20 IU/L (ref 0–40)
Albumin/Globulin Ratio: 2.1 (ref 1.2–2.2)
Albumin: 4.5 g/dL (ref 3.8–4.9)
Alkaline Phosphatase: 80 IU/L (ref 44–121)
BUN/Creatinine Ratio: 13 (ref 9–20)
BUN: 15 mg/dL (ref 6–24)
Bilirubin Total: 0.4 mg/dL (ref 0.0–1.2)
CO2: 20 mmol/L (ref 20–29)
Calcium: 9.7 mg/dL (ref 8.7–10.2)
Chloride: 106 mmol/L (ref 96–106)
Creatinine, Ser: 1.12 mg/dL (ref 0.76–1.27)
Globulin, Total: 2.1 g/dL (ref 1.5–4.5)
Glucose: 63 mg/dL — ABNORMAL LOW (ref 65–99)
Potassium: 4.7 mmol/L (ref 3.5–5.2)
Sodium: 141 mmol/L (ref 134–144)
Total Protein: 6.6 g/dL (ref 6.0–8.5)
eGFR: 77 mL/min/{1.73_m2} (ref >59)

## 2020-10-14 LAB — LIPID PANEL
Chol/HDL Ratio: 2.8 ratio (ref 0.0–5.0)
Cholesterol, Total: 105 mg/dL (ref 100–199)
HDL: 37 mg/dL — ABNORMAL LOW (ref >39)
LDL Chol Calc (NIH): 44 mg/dL (ref 0–99)
Triglycerides: 136 mg/dL (ref 0–149)
VLDL Cholesterol Cal: 24 mg/dL (ref 5–40)

## 2020-12-01 ENCOUNTER — Other Ambulatory Visit: Payer: Self-pay | Admitting: Student in an Organized Health Care Education/Training Program
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
## 2020-12-13 ENCOUNTER — Encounter: Payer: Self-pay | Admitting: Cardiology

## 2020-12-13 ENCOUNTER — Ambulatory Visit (INDEPENDENT_AMBULATORY_CARE_PROVIDER_SITE_OTHER): Payer: Medicaid Other | Admitting: Cardiology

## 2020-12-13 ENCOUNTER — Other Ambulatory Visit: Payer: Self-pay

## 2020-12-13 VITALS — BP 116/74 | HR 59 | Ht 70.5 in | Wt 229.4 lb

## 2020-12-13 DIAGNOSIS — I209 Angina pectoris, unspecified: Secondary | ICD-10-CM

## 2020-12-13 DIAGNOSIS — E785 Hyperlipidemia, unspecified: Secondary | ICD-10-CM | POA: Diagnosis not present

## 2020-12-13 DIAGNOSIS — I2510 Atherosclerotic heart disease of native coronary artery without angina pectoris: Secondary | ICD-10-CM | POA: Diagnosis not present

## 2020-12-13 DIAGNOSIS — I714 Abdominal aortic aneurysm, without rupture, unspecified: Secondary | ICD-10-CM

## 2020-12-13 DIAGNOSIS — Z720 Tobacco use: Secondary | ICD-10-CM

## 2020-12-13 NOTE — Patient Instructions (Signed)
Medication Instructions:  Your physician recommends that you continue on your current medications as directed. Please refer to the Current Medication list given to you today.   *If you need a refill on your cardiac medications before your next appointment, please call your pharmacy*  Lab Work: NONE   Testing/Procedures: Your physician has requested that you have a lexiscan myoview. For further information please visit [email or web address] Please follow instruction sheet, as given.  Your physician has requested that you have an abdominal aorta duplex. During this test, an ultrasound is used to evaluate the aorta. Allow 30 minutes for this exam. Do not eat after midnight the day before and avoid carbonated beverages TO BE DONE AT [facility] WHERE DONE IN THE PAST   Follow-Up: At [facility], you and your health needs are our priority.  As part of our continuing mission to provide you with exceptional heart care, we have created designated Provider Care Teams.  These Care Teams include your primary Cardiologist (physician) and Advanced Practice Providers (APPs -  Physician Assistants and Nurse Practitioners) who all work together to provide you with the care you need, when you need it.  We recommend signing up for the patient portal called "MyChart".  Sign up information is provided on this After Visit Summary.  MyChart is used to connect with patients for Virtual Visits (Telemedicine).  Patients are able to view lab/test results, encounter notes, upcoming appointments, etc.  Non-urgent messages can be sent to your provider as well.   To learn more about what you can do with MyChart, go to [email or web address].    Your next appointment:   6 month(s)  The format for your next appointment:   In Person  Provider:   You will see one of the following Advanced Practice Providers on your designated Care Team:    Theodore Demark, PA-C  Joni Reining, DNP, ANP  Then, DR [facility]  will plan to  see you again in 12 month(s).

## 2020-12-27 ENCOUNTER — Other Ambulatory Visit: Payer: Self-pay | Admitting: Student in an Organized Health Care Education/Training Program

## 2020-12-27 DIAGNOSIS — I25708 Atherosclerosis of coronary artery bypass graft(s), unspecified, with other forms of angina pectoris: Secondary | ICD-10-CM

## 2020-12-28 ENCOUNTER — Telehealth: Payer: Self-pay | Admitting: *Deleted

## 2020-12-28 NOTE — Telephone Encounter (Signed)
Received call from Para March with Dr Thayer Ohm, pain management  Sent last office visit that ok's patient to have steroid Dosepak However office is needing ok for muscle relaxer if needed  Fax results to [phone] phone [phone] Para March if this weed, Jamar if next week  Will forward to Dr Antoine Poche for review

## 2020-12-29 NOTE — Telephone Encounter (Signed)
Will fax this note to the number provided. 

## 2020-12-30 ENCOUNTER — Telehealth (HOSPITAL_COMMUNITY): Payer: Self-pay | Admitting: *Deleted

## 2020-12-30 NOTE — Telephone Encounter (Signed)
Close encounter 

## 2021-01-03 ENCOUNTER — Other Ambulatory Visit: Payer: Self-pay

## 2021-01-03 ENCOUNTER — Ambulatory Visit (HOSPITAL_COMMUNITY)
Admission: RE | Admit: 2021-01-03 | Discharge: 2021-01-03 | Disposition: A | Discharge status: Home / Self Care | Payer: Medicaid Other | Source: Ambulatory Visit | Attending: Internal Medicine | Admitting: Internal Medicine

## 2021-01-03 DIAGNOSIS — I2510 Atherosclerotic heart disease of native coronary artery without angina pectoris: Secondary | ICD-10-CM | POA: Diagnosis not present

## 2021-01-03 DIAGNOSIS — E785 Hyperlipidemia, unspecified: Secondary | ICD-10-CM

## 2021-01-03 DIAGNOSIS — I209 Angina pectoris, unspecified: Secondary | ICD-10-CM | POA: Insufficient documentation

## 2021-01-03 LAB — MYOCARDIAL PERFUSION IMAGING
LV dias vol: 164 mL (ref 62–150)
LV sys vol: 89 mL
Peak HR: 70 {beats}/min
Rest HR: 47 {beats}/min
SDS: 0
SRS: 0
SSS: 0
TID: 1.03

## 2021-01-03 MED ORDER — TECHNETIUM TC 99M TETROFOSMIN IV KIT
10.6000 | PACK | Freq: Once | INTRAVENOUS | Status: AC | PRN
  Administered 2021-01-03: 10.6 via INTRAVENOUS
  Filled 2021-01-03: qty 11

## 2021-01-03 MED ORDER — REGADENOSON 0.4 MG/5ML IV SOLN
0.4000 mg | Freq: Once | INTRAVENOUS | Status: AC
  Administered 2021-01-03: 0.4 mg via INTRAVENOUS

## 2021-01-03 MED ORDER — TECHNETIUM TC 99M TETROFOSMIN IV KIT
32.8000 | PACK | Freq: Once | INTRAVENOUS | Status: AC | PRN
  Administered 2021-01-03: 32.8 via INTRAVENOUS
  Filled 2021-01-03: qty 33

## 2021-01-04 ENCOUNTER — Telehealth: Payer: Self-pay

## 2021-01-04 NOTE — Telephone Encounter (Signed)
Patient calls nurse line regarding switching venlafaxine for cymbalta 30 mg. Patient reports that pain management doctor recommends this change, as well as cardiologist.   Advised patient that he would likely need to schedule appointment for this change and to meet new PCP. Patient declines at this time.   Please advise if change is appropriate.   Veronda Prude, RN

## 2021-01-20 ENCOUNTER — Telehealth: Payer: Self-pay | Admitting: Student

## 2021-01-20 NOTE — Telephone Encounter (Signed)
..   Medicaid Managed Care   Unsuccessful Outreach Note  01/20/2021 Name: George [name] MRN: [id number] DOB: May 16, 1966  Referred by: Jerre Simon, MD Reason for referral : High Risk Managed Medicaid (Called patient to see about getting him scheduled with the MM Care Team. I left a message on his VM with my name and number.)   An unsuccessful telephone outreach was attempted today. The patient was referred to the case management team for assistance with care management and care coordination.   Follow Up Plan: The care management team will reach out to the patient again over the next 7-14 days.   Weston Settle Care Guide, High Risk Medicaid Managed Care Embedded Care Coordination [facility]  [facility]

## 2021-01-23 NOTE — Telephone Encounter (Signed)
Patient LVM on nurse line checking the status of medication change. This was originally sent to previous PCP, however was never addressed. Will forward to new PCP.

## 2021-01-30 ENCOUNTER — Telehealth (INDEPENDENT_AMBULATORY_CARE_PROVIDER_SITE_OTHER): Payer: Medicaid Other | Admitting: Family Medicine

## 2021-01-30 ENCOUNTER — Encounter: Payer: Self-pay | Admitting: Family Medicine

## 2021-01-30 DIAGNOSIS — I2510 Atherosclerotic heart disease of native coronary artery without angina pectoris: Secondary | ICD-10-CM

## 2021-01-30 DIAGNOSIS — M545 Low back pain, unspecified: Secondary | ICD-10-CM | POA: Diagnosis not present

## 2021-01-30 DIAGNOSIS — E7849 Other hyperlipidemia: Secondary | ICD-10-CM | POA: Diagnosis not present

## 2021-01-30 DIAGNOSIS — G8929 Other chronic pain: Secondary | ICD-10-CM | POA: Diagnosis not present

## 2021-01-30 DIAGNOSIS — I25708 Atherosclerosis of coronary artery bypass graft(s), unspecified, with other forms of angina pectoris: Secondary | ICD-10-CM | POA: Diagnosis not present

## 2021-01-30 MED ORDER — LISINOPRIL 5 MG PO TABS: 5.0000 mg | ORAL_TABLET | Freq: Every day | ORAL | 2 refills | Status: DC

## 2021-01-30 MED ORDER — ISOSORBIDE MONONITRATE ER 30 MG PO TB24: 30.0000 mg | ORAL_TABLET | Freq: Every day | ORAL | 0 refills | Status: DC

## 2021-01-30 MED ORDER — ROSUVASTATIN CALCIUM 40 MG PO TABS: 40.0000 mg | ORAL_TABLET | Freq: Every day | ORAL | 0 refills | Status: DC

## 2021-01-30 MED ORDER — DULOXETINE HCL 20 MG PO CPEP: ORAL_CAPSULE | ORAL | 0 refills | Status: DC

## 2021-01-30 NOTE — Progress Notes (Signed)
      [facility] Telemedicine Visit  Patient consented to have virtual visit. Method of visit: Video  Encounter participants: Patient: George [name] - located at home Provider: Katha Cabal - located at [facility] Others (if applicable):   Chief Complaint: medication refill      HPI:  Pt has been having difficulty getting some of his medications refilled. He has been out of his cholesterol, BP and mood medication. Pt previously prescribed Venlafaxine. Pt in a pain management program. Has cleared with his cardiologist and pain management doctor to trial Cymbalta.   Pt is not sleeping due to increased stress as he takes care of both his mom and dad. Not taking muscle relaxers anymore. Nothing he can do to get away from his pain.   ROS: per HPI  Pertinent PMHx: chronic pain, chronic low back pain, ?fibromyalgia   Exam:  Respiratory: speaking in full sentences without pause  Assessment/Plan:  Hyperlipemia Refilled Crestor. LDL 44 in April 2022.   CORONARY ATHEROSCLEROSIS NATIVE CORONARY ARTERY Refilled Imdur and Lisinopril. Recent cardiology note reviewed.  BACK PAIN, LUMBAR Add Cymbalta for neurogenic pain. Start 20 mg and titrate up to 40 mg daily. Pt has seen cardiologist and pain medicine provider who agree with starting Cymbalta. Consider restarting baclofen.     Time spent during visit with patient: 16 minutes   Katha Cabal, DO

## 2021-01-30 NOTE — Progress Notes (Signed)
Patient states that he is trying to log into mychart but it keeps kicking him out. George [name], CMA

## 2021-02-01 NOTE — Assessment & Plan Note (Addendum)
Refilled Crestor. LDL 44 in April 2022.

## 2021-02-01 NOTE — Assessment & Plan Note (Signed)
Add Cymbalta for neurogenic pain. Start 20 mg and titrate up to 40 mg daily. Pt has seen cardiologist and pain medicine provider who agree with starting Cymbalta. Consider restarting baclofen.

## 2021-02-01 NOTE — Assessment & Plan Note (Addendum)
Refilled Imdur and Lisinopril. Recent cardiology note reviewed.

## 2021-02-07 ENCOUNTER — Telehealth: Payer: Self-pay | Admitting: Student

## 2021-02-07 NOTE — Telephone Encounter (Signed)
  Medicaid Managed Care   Unsuccessful Outreach Note  02/07/2021 Name: George [name] MRN: [id number] DOB: 11-08-1965  Referred by: Jerre Simon, MD Reason for referral : High Risk Managed Medicaid (I called [name] today to offer him a phone visit with the [facility] Team. I had to leave a message on his VM.)   A second unsuccessful telephone outreach was attempted today. The patient was referred to the case management team for assistance with care management and care coordination.   Follow Up Plan: The care management team will reach out to the patient again over the next 7-14 days.   Weston Settle Care Guide, High Risk Medicaid Managed Care Embedded Care Coordination [facility]  [facility]

## 2021-02-17 ENCOUNTER — Ambulatory Visit (HOSPITAL_COMMUNITY): Admission: RE | Admit: 2021-02-17 | Payer: Medicaid Other | Source: Ambulatory Visit

## 2021-02-22 ENCOUNTER — Other Ambulatory Visit: Payer: Self-pay

## 2021-02-22 ENCOUNTER — Other Ambulatory Visit (HOSPITAL_COMMUNITY): Payer: Self-pay | Admitting: Cardiology

## 2021-02-22 ENCOUNTER — Ambulatory Visit (HOSPITAL_COMMUNITY)
Admission: RE | Admit: 2021-02-22 | Discharge: 2021-02-22 | Disposition: A | Discharge status: Home / Self Care | Payer: Medicaid Other | Source: Ambulatory Visit | Attending: Cardiology | Admitting: Cardiology

## 2021-02-22 DIAGNOSIS — I714 Abdominal aortic aneurysm, without rupture, unspecified: Secondary | ICD-10-CM

## 2021-02-22 DIAGNOSIS — Z720 Tobacco use: Secondary | ICD-10-CM | POA: Diagnosis not present

## 2021-02-24 ENCOUNTER — Encounter: Payer: Self-pay | Admitting: *Deleted

## 2021-04-10 ENCOUNTER — Other Ambulatory Visit: Payer: Self-pay | Admitting: Student in an Organized Health Care Education/Training Program

## 2021-04-10 DIAGNOSIS — I10 Essential (primary) hypertension: Secondary | ICD-10-CM

## 2021-04-18 ENCOUNTER — Other Ambulatory Visit: Payer: Self-pay | Admitting: Family Medicine

## 2021-04-18 DIAGNOSIS — G8929 Other chronic pain: Secondary | ICD-10-CM

## 2021-04-18 DIAGNOSIS — M545 Low back pain, unspecified: Secondary | ICD-10-CM

## 2021-04-20 DIAGNOSIS — M545 Low back pain, unspecified: Secondary | ICD-10-CM | POA: Diagnosis not present

## 2021-04-20 DIAGNOSIS — G894 Chronic pain syndrome: Secondary | ICD-10-CM | POA: Diagnosis not present

## 2021-04-20 DIAGNOSIS — M179 Osteoarthritis of knee, unspecified: Secondary | ICD-10-CM | POA: Diagnosis not present

## 2021-04-25 DIAGNOSIS — M5136 Other intervertebral disc degeneration, lumbar region: Secondary | ICD-10-CM | POA: Diagnosis not present

## 2021-04-25 DIAGNOSIS — M179 Osteoarthritis of knee, unspecified: Secondary | ICD-10-CM | POA: Diagnosis not present

## 2021-04-25 DIAGNOSIS — M545 Low back pain, unspecified: Secondary | ICD-10-CM | POA: Diagnosis not present

## 2021-04-25 DIAGNOSIS — G894 Chronic pain syndrome: Secondary | ICD-10-CM | POA: Diagnosis not present

## 2021-05-11 ENCOUNTER — Other Ambulatory Visit: Payer: Self-pay | Admitting: Student

## 2021-05-11 ENCOUNTER — Other Ambulatory Visit: Payer: Self-pay | Admitting: Family Medicine

## 2021-05-11 DIAGNOSIS — G8929 Other chronic pain: Secondary | ICD-10-CM

## 2021-05-11 DIAGNOSIS — M545 Low back pain, unspecified: Secondary | ICD-10-CM

## 2021-05-11 DIAGNOSIS — I2510 Atherosclerotic heart disease of native coronary artery without angina pectoris: Secondary | ICD-10-CM

## 2021-05-11 DIAGNOSIS — I25708 Atherosclerosis of coronary artery bypass graft(s), unspecified, with other forms of angina pectoris: Secondary | ICD-10-CM

## 2021-06-07 ENCOUNTER — Other Ambulatory Visit: Payer: Self-pay

## 2021-06-07 DIAGNOSIS — M545 Low back pain, unspecified: Secondary | ICD-10-CM

## 2021-06-07 NOTE — Telephone Encounter (Signed)
Patient calls nurse line requesting a refill on Cymbalta. Patient reports he is taking (2) 20mg  capsules daily.   Please advise.

## 2021-06-10 ENCOUNTER — Other Ambulatory Visit: Payer: Self-pay | Admitting: Student

## 2021-06-10 DIAGNOSIS — M545 Low back pain, unspecified: Secondary | ICD-10-CM

## 2021-06-12 ENCOUNTER — Telehealth: Payer: Self-pay | Admitting: *Deleted

## 2021-06-12 DIAGNOSIS — R252 Cramp and spasm: Secondary | ICD-10-CM

## 2021-06-12 MED ORDER — BACLOFEN 10 MG PO TABS
10.0000 mg | ORAL_TABLET | Freq: Three times a day (TID) | ORAL | 0 refills | Status: DC
Start: 2021-06-12 — End: 2021-11-03

## 2021-06-12 NOTE — Telephone Encounter (Signed)
Called patient's about his refill for Cymbalta.  Patient reports much improvement on the 20 mg twice daily dose.  We will titrate up to 40 mg daily.  Patient report good tolerance with the medication. He denies any nausea, dizziness, blurry vision or constipation.  We will restart patient's baclofen for muscle spasm.  Patient reports baclofen has worked in the past and was discontinued and he is now having new onset abdominal cramps and lower extremity cramps.  We will start patient on 10 mg TID of baclofen.  We will titrate up as needed.

## 2021-06-12 NOTE — Telephone Encounter (Signed)
Patient calls nurse line again checking the status of medication refill. Patient reports he is now completely out.   Patient is also requesting a refill on Baclofen. Patient reports he received this medication ~6 month ago from previous PCP.

## 2021-06-12 NOTE — Telephone Encounter (Signed)
Received refill request for methocarbam 500 mg tab, did not see on current med list.  Please send Rx if appropriate. Ivone Licht Zimmerman Rumple, CMA

## 2021-06-12 NOTE — Telephone Encounter (Signed)
Spoke with patient on the phone this morning.  He reports better result with 2 capsules of fluoxetine 20 mg daily.  Medication was well-tolerated and patient denies dizziness, sleepiness or nausea. Will increase patient's dose to 40 mg daily.  In addition patient reports increased muscle spasm mostly in the upper abdominal region since discontinuing baclofen.  Will restart patient on baclofen 10 mg 3 times daily and titrate up if needed.

## 2021-07-06 ENCOUNTER — Other Ambulatory Visit: Payer: Self-pay | Admitting: Family Medicine

## 2021-07-06 DIAGNOSIS — I2510 Atherosclerotic heart disease of native coronary artery without angina pectoris: Secondary | ICD-10-CM

## 2021-07-20 ENCOUNTER — Other Ambulatory Visit: Payer: Self-pay | Admitting: Student

## 2021-07-20 DIAGNOSIS — I10 Essential (primary) hypertension: Secondary | ICD-10-CM

## 2021-08-09 ENCOUNTER — Other Ambulatory Visit: Payer: Self-pay | Admitting: Student

## 2021-08-09 DIAGNOSIS — M545 Low back pain, unspecified: Secondary | ICD-10-CM

## 2021-08-09 DIAGNOSIS — I2510 Atherosclerotic heart disease of native coronary artery without angina pectoris: Secondary | ICD-10-CM

## 2021-08-09 DIAGNOSIS — I25708 Atherosclerosis of coronary artery bypass graft(s), unspecified, with other forms of angina pectoris: Secondary | ICD-10-CM

## 2021-09-11 ENCOUNTER — Other Ambulatory Visit: Payer: Self-pay | Admitting: Student

## 2021-09-11 DIAGNOSIS — M545 Low back pain, unspecified: Secondary | ICD-10-CM

## 2021-09-11 DIAGNOSIS — G8929 Other chronic pain: Secondary | ICD-10-CM

## 2021-09-19 ENCOUNTER — Ambulatory Visit (HOSPITAL_COMMUNITY)
Admission: RE | Admit: 2021-09-19 | Discharge: 2021-09-19 | Disposition: A | Discharge status: Home / Self Care | Payer: Medicaid Other | Source: Ambulatory Visit | Attending: Cardiology | Admitting: Cardiology

## 2021-09-19 ENCOUNTER — Other Ambulatory Visit: Payer: Self-pay

## 2021-09-19 DIAGNOSIS — I714 Abdominal aortic aneurysm, without rupture, unspecified: Secondary | ICD-10-CM | POA: Diagnosis present

## 2021-09-19 DIAGNOSIS — I7141 Pararenal abdominal aortic aneurysm, without rupture: Secondary | ICD-10-CM

## 2021-09-25 ENCOUNTER — Encounter: Payer: Self-pay | Admitting: *Deleted

## 2021-10-05 ENCOUNTER — Other Ambulatory Visit (HOSPITAL_COMMUNITY): Payer: Self-pay | Admitting: Cardiology

## 2021-10-05 DIAGNOSIS — I7143 Infrarenal abdominal aortic aneurysm, without rupture: Secondary | ICD-10-CM

## 2021-10-09 ENCOUNTER — Other Ambulatory Visit: Payer: Self-pay | Admitting: Student

## 2021-10-09 DIAGNOSIS — M545 Low back pain, unspecified: Secondary | ICD-10-CM

## 2021-10-09 DIAGNOSIS — I2510 Atherosclerotic heart disease of native coronary artery without angina pectoris: Secondary | ICD-10-CM

## 2021-10-09 DIAGNOSIS — I10 Essential (primary) hypertension: Secondary | ICD-10-CM

## 2021-11-03 ENCOUNTER — Encounter: Payer: Self-pay | Admitting: Student

## 2021-11-03 ENCOUNTER — Other Ambulatory Visit: Payer: Self-pay | Admitting: Student

## 2021-11-03 DIAGNOSIS — R252 Cramp and spasm: Secondary | ICD-10-CM

## 2021-11-03 MED ORDER — BACLOFEN 10 MG PO TABS: 10.0000 mg | ORAL_TABLET | Freq: Three times a day (TID) | ORAL | 0 refills | Status: DC

## 2021-11-03 NOTE — Progress Notes (Signed)
Refilled baclofen for back spasm. ?

## 2021-11-16 ENCOUNTER — Other Ambulatory Visit: Payer: Self-pay | Admitting: Student

## 2021-11-16 DIAGNOSIS — M545 Low back pain, unspecified: Secondary | ICD-10-CM

## 2021-12-02 ENCOUNTER — Other Ambulatory Visit: Payer: Self-pay | Admitting: Student

## 2021-12-02 DIAGNOSIS — I25708 Atherosclerosis of coronary artery bypass graft(s), unspecified, with other forms of angina pectoris: Secondary | ICD-10-CM

## 2021-12-02 DIAGNOSIS — I2510 Atherosclerotic heart disease of native coronary artery without angina pectoris: Secondary | ICD-10-CM

## 2021-12-12 ENCOUNTER — Encounter: Payer: Self-pay | Admitting: *Deleted

## 2021-12-18 ENCOUNTER — Other Ambulatory Visit: Payer: Self-pay | Admitting: Student

## 2021-12-18 ENCOUNTER — Other Ambulatory Visit: Payer: Self-pay | Admitting: Family Medicine

## 2021-12-18 DIAGNOSIS — M545 Low back pain, unspecified: Secondary | ICD-10-CM

## 2021-12-18 DIAGNOSIS — I25708 Atherosclerosis of coronary artery bypass graft(s), unspecified, with other forms of angina pectoris: Secondary | ICD-10-CM

## 2021-12-18 DIAGNOSIS — G8929 Other chronic pain: Secondary | ICD-10-CM

## 2022-01-15 ENCOUNTER — Other Ambulatory Visit: Payer: Self-pay | Admitting: Student

## 2022-01-15 DIAGNOSIS — I2510 Atherosclerotic heart disease of native coronary artery without angina pectoris: Secondary | ICD-10-CM

## 2022-01-15 DIAGNOSIS — G8929 Other chronic pain: Secondary | ICD-10-CM

## 2022-01-26 ENCOUNTER — Other Ambulatory Visit: Payer: Self-pay | Admitting: Student

## 2022-01-26 DIAGNOSIS — R252 Cramp and spasm: Secondary | ICD-10-CM

## 2022-01-26 DIAGNOSIS — I10 Essential (primary) hypertension: Secondary | ICD-10-CM

## 2022-01-26 MED ORDER — BACLOFEN 10 MG PO TABS: 10.0000 mg | ORAL_TABLET | Freq: Three times a day (TID) | ORAL | 0 refills | Status: DC

## 2022-02-13 ENCOUNTER — Other Ambulatory Visit: Payer: Self-pay | Admitting: Student

## 2022-02-13 DIAGNOSIS — M545 Low back pain, unspecified: Secondary | ICD-10-CM

## 2022-02-13 DIAGNOSIS — R252 Cramp and spasm: Secondary | ICD-10-CM

## 2022-03-05 DIAGNOSIS — M48062 Spinal stenosis, lumbar region with neurogenic claudication: Secondary | ICD-10-CM | POA: Diagnosis not present

## 2022-03-05 DIAGNOSIS — M179 Osteoarthritis of knee, unspecified: Secondary | ICD-10-CM | POA: Diagnosis not present

## 2022-03-05 DIAGNOSIS — M5136 Other intervertebral disc degeneration, lumbar region: Secondary | ICD-10-CM | POA: Diagnosis not present

## 2022-03-05 DIAGNOSIS — G894 Chronic pain syndrome: Secondary | ICD-10-CM | POA: Diagnosis not present

## 2022-03-08 ENCOUNTER — Other Ambulatory Visit: Payer: Self-pay | Admitting: Student

## 2022-03-08 DIAGNOSIS — R252 Cramp and spasm: Secondary | ICD-10-CM

## 2022-03-09 MED ORDER — BACLOFEN 10 MG PO TABS: 10.0000 mg | ORAL_TABLET | Freq: Three times a day (TID) | ORAL | 0 refills | Status: DC

## 2022-03-20 ENCOUNTER — Other Ambulatory Visit: Payer: Self-pay | Admitting: Student

## 2022-03-20 DIAGNOSIS — M545 Low back pain, unspecified: Secondary | ICD-10-CM

## 2022-03-26 DIAGNOSIS — M545 Low back pain, unspecified: Secondary | ICD-10-CM | POA: Diagnosis not present

## 2022-03-26 DIAGNOSIS — I252 Old myocardial infarction: Secondary | ICD-10-CM | POA: Diagnosis not present

## 2022-03-26 DIAGNOSIS — I10 Essential (primary) hypertension: Secondary | ICD-10-CM | POA: Diagnosis not present

## 2022-03-26 DIAGNOSIS — M542 Cervicalgia: Secondary | ICD-10-CM | POA: Diagnosis not present

## 2022-03-26 DIAGNOSIS — I714 Abdominal aortic aneurysm, without rupture, unspecified: Secondary | ICD-10-CM | POA: Diagnosis not present

## 2022-03-26 DIAGNOSIS — Z79899 Other long term (current) drug therapy: Secondary | ICD-10-CM | POA: Diagnosis not present

## 2022-03-26 DIAGNOSIS — M5136 Other intervertebral disc degeneration, lumbar region: Secondary | ICD-10-CM | POA: Diagnosis not present

## 2022-04-03 ENCOUNTER — Other Ambulatory Visit: Payer: Self-pay | Admitting: Student

## 2022-04-03 DIAGNOSIS — R252 Cramp and spasm: Secondary | ICD-10-CM

## 2022-04-04 MED ORDER — BACLOFEN 10 MG PO TABS: 10.0000 mg | ORAL_TABLET | Freq: Three times a day (TID) | ORAL | 0 refills | Status: DC

## 2022-04-05 DIAGNOSIS — Z79899 Other long term (current) drug therapy: Secondary | ICD-10-CM | POA: Diagnosis not present

## 2022-04-14 ENCOUNTER — Other Ambulatory Visit: Payer: Self-pay | Admitting: Student

## 2022-04-14 DIAGNOSIS — I25708 Atherosclerosis of coronary artery bypass graft(s), unspecified, with other forms of angina pectoris: Secondary | ICD-10-CM

## 2022-04-14 DIAGNOSIS — I2510 Atherosclerotic heart disease of native coronary artery without angina pectoris: Secondary | ICD-10-CM

## 2022-04-16 ENCOUNTER — Other Ambulatory Visit: Payer: Self-pay | Admitting: Student

## 2022-04-16 DIAGNOSIS — I2510 Atherosclerotic heart disease of native coronary artery without angina pectoris: Secondary | ICD-10-CM

## 2022-04-16 DIAGNOSIS — G8929 Other chronic pain: Secondary | ICD-10-CM

## 2022-04-16 DIAGNOSIS — I25708 Atherosclerosis of coronary artery bypass graft(s), unspecified, with other forms of angina pectoris: Secondary | ICD-10-CM

## 2022-04-18 DIAGNOSIS — R001 Bradycardia, unspecified: Secondary | ICD-10-CM | POA: Insufficient documentation
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
## 2022-04-19 ENCOUNTER — Encounter: Payer: Self-pay | Admitting: Cardiology

## 2022-04-19 ENCOUNTER — Encounter (HOSPITAL_COMMUNITY): Payer: Self-pay

## 2022-04-19 ENCOUNTER — Ambulatory Visit (INDEPENDENT_AMBULATORY_CARE_PROVIDER_SITE_OTHER): Payer: Medicaid Other | Admitting: Cardiology

## 2022-04-19 ENCOUNTER — Ambulatory Visit (HOSPITAL_COMMUNITY)
Admission: RE | Admit: 2022-04-19 | Discharge: 2022-04-19 | Disposition: A | Discharge status: Home / Self Care | Payer: Medicaid Other | Source: Ambulatory Visit | Attending: Cardiology | Admitting: Cardiology

## 2022-04-19 VITALS — BP 122/86 | HR 54 | Ht 68.5 in | Wt 252.2 lb

## 2022-04-19 DIAGNOSIS — E785 Hyperlipidemia, unspecified: Secondary | ICD-10-CM | POA: Diagnosis not present

## 2022-04-19 DIAGNOSIS — I2510 Atherosclerotic heart disease of native coronary artery without angina pectoris: Secondary | ICD-10-CM

## 2022-04-19 DIAGNOSIS — Z720 Tobacco use: Secondary | ICD-10-CM

## 2022-04-19 DIAGNOSIS — I7143 Infrarenal abdominal aortic aneurysm, without rupture: Secondary | ICD-10-CM

## 2022-04-19 DIAGNOSIS — R001 Bradycardia, unspecified: Secondary | ICD-10-CM | POA: Diagnosis not present

## 2022-04-19 NOTE — Patient Instructions (Signed)
Medication Instructions:  Your physician recommends that you continue on your current medications as directed. Please refer to the Current Medication list given to you today.  *If you need a refill on your cardiac medications before your next appointment, please call your pharmacy*   Lab Work: Your physician recommends that you have the following labs drawn today: CMET, CBC, Lipid profile and Lpa  If you have labs (blood work) drawn today and your tests are completely normal, you will receive your results only by: [place] (if you have MyChart) OR A paper copy in the mail If you have any lab test that is abnormal or we need to change your treatment, we will call you to review the results.   Testing/Procedures: NONE ordered at this time of appointment   Follow-Up: At [facility], you and your health needs are our priority.  As part of our continuing mission to provide you with exceptional heart care, we have created designated Provider Care Teams.  These Care Teams include your primary Cardiologist (physician) and Advanced Practice Providers (APPs -  Physician Assistants and Nurse Practitioners) who all work together to provide you with the care you need, when you need it.  We recommend signing up for the patient portal called "MyChart".  Sign up information is provided on this After Visit Summary.  MyChart is used to connect with patients for Virtual Visits (Telemedicine).  Patients are able to view lab/test results, encounter notes, upcoming appointments, etc.  Non-urgent messages can be sent to your provider as well.   To learn more about what you can do with MyChart, go to [email or web address].    Your next appointment:   1 year(s)  The format for your next appointment:   In Person  Provider:   Minus Breeding, MD     Important Information About Sugar

## 2022-04-20 LAB — LIPOPROTEIN A (LPA): Lipoprotein (a): 28.1 nmol/L (ref <75.0)

## 2022-04-20 LAB — COMPREHENSIVE METABOLIC PANEL
ALT: 28 IU/L (ref 0–44)
AST: 21 IU/L (ref 0–40)
Albumin/Globulin Ratio: 2.2 (ref 1.2–2.2)
Albumin: 4.6 g/dL (ref 3.8–4.9)
Alkaline Phosphatase: 77 IU/L (ref 44–121)
BUN/Creatinine Ratio: 12 (ref 9–20)
BUN: 15 mg/dL (ref 6–24)
Bilirubin Total: 0.5 mg/dL (ref 0.0–1.2)
CO2: 26 mmol/L (ref 20–29)
Calcium: 9.7 mg/dL (ref 8.7–10.2)
Chloride: 100 mmol/L (ref 96–106)
Creatinine, Ser: 1.23 mg/dL (ref 0.76–1.27)
Globulin, Total: 2.1 g/dL (ref 1.5–4.5)
Glucose: 95 mg/dL (ref 70–99)
Potassium: 5 mmol/L (ref 3.5–5.2)
Sodium: 139 mmol/L (ref 134–144)
Total Protein: 6.7 g/dL (ref 6.0–8.5)
eGFR: 68 mL/min/{1.73_m2} (ref >59)

## 2022-04-20 LAB — LIPID PANEL
Chol/HDL Ratio: 3.8 ratio (ref 0.0–5.0)
Cholesterol, Total: 184 mg/dL (ref 100–199)
HDL: 48 mg/dL (ref >39)
LDL Chol Calc (NIH): 102 mg/dL — ABNORMAL HIGH (ref 0–99)
Triglycerides: 200 mg/dL — ABNORMAL HIGH (ref 0–149)
VLDL Cholesterol Cal: 34 mg/dL (ref 5–40)

## 2022-04-20 LAB — CBC
Hematocrit: 43.6 % (ref 37.5–51.0)
Hemoglobin: 14.3 g/dL (ref 13.0–17.7)
MCH: 29.4 pg (ref 26.6–33.0)
MCHC: 32.8 g/dL (ref 31.5–35.7)
MCV: 90 fL (ref 79–97)
Platelets: 203 10*3/uL (ref 150–450)
RBC: 4.87 x10E6/uL (ref 4.14–5.80)
RDW: 12.7 % (ref 11.6–15.4)
WBC: 5.8 10*3/uL (ref 3.4–10.8)

## 2022-04-23 ENCOUNTER — Other Ambulatory Visit: Payer: Self-pay | Admitting: Student

## 2022-04-23 DIAGNOSIS — R252 Cramp and spasm: Secondary | ICD-10-CM

## 2022-04-25 MED ORDER — BACLOFEN 10 MG PO TABS: 10.0000 mg | ORAL_TABLET | Freq: Three times a day (TID) | ORAL | 0 refills | Status: DC

## 2022-04-26 DIAGNOSIS — Z79899 Other long term (current) drug therapy: Secondary | ICD-10-CM | POA: Diagnosis not present

## 2022-05-01 DIAGNOSIS — Z79899 Other long term (current) drug therapy: Secondary | ICD-10-CM | POA: Diagnosis not present

## 2022-05-08 ENCOUNTER — Other Ambulatory Visit: Payer: Self-pay | Admitting: Student

## 2022-05-08 DIAGNOSIS — I10 Essential (primary) hypertension: Secondary | ICD-10-CM

## 2022-05-08 DIAGNOSIS — R252 Cramp and spasm: Secondary | ICD-10-CM

## 2022-05-08 MED ORDER — METOPROLOL SUCCINATE ER 25 MG PO TB24: 25.0000 mg | ORAL_TABLET | Freq: Every day | ORAL | 0 refills | Status: DC

## 2022-05-08 MED ORDER — BACLOFEN 10 MG PO TABS: 10.0000 mg | ORAL_TABLET | Freq: Three times a day (TID) | ORAL | 0 refills | Status: DC

## 2022-05-11 ENCOUNTER — Other Ambulatory Visit: Payer: Self-pay | Admitting: Student

## 2022-05-11 DIAGNOSIS — R252 Cramp and spasm: Secondary | ICD-10-CM

## 2022-05-11 DIAGNOSIS — I10 Essential (primary) hypertension: Secondary | ICD-10-CM

## 2022-05-19 ENCOUNTER — Other Ambulatory Visit: Payer: Self-pay | Admitting: Student

## 2022-05-19 DIAGNOSIS — G8929 Other chronic pain: Secondary | ICD-10-CM

## 2022-05-21 ENCOUNTER — Other Ambulatory Visit: Payer: Self-pay | Admitting: Student

## 2022-05-21 DIAGNOSIS — G8929 Other chronic pain: Secondary | ICD-10-CM

## 2022-05-21 MED ORDER — DULOXETINE HCL 20 MG PO CPEP: 40.0000 mg | ORAL_CAPSULE | Freq: Every day | ORAL | 0 refills | Status: DC

## 2022-05-24 DIAGNOSIS — Z79899 Other long term (current) drug therapy: Secondary | ICD-10-CM | POA: Diagnosis not present

## 2022-05-28 DIAGNOSIS — Z79899 Other long term (current) drug therapy: Secondary | ICD-10-CM | POA: Diagnosis not present

## 2022-05-31 ENCOUNTER — Other Ambulatory Visit: Payer: Self-pay | Admitting: Student

## 2022-05-31 DIAGNOSIS — R252 Cramp and spasm: Secondary | ICD-10-CM

## 2022-06-04 MED ORDER — BACLOFEN 10 MG PO TABS: 10.0000 mg | ORAL_TABLET | Freq: Three times a day (TID) | ORAL | 0 refills | Status: AC

## 2022-06-07 DIAGNOSIS — Z79899 Other long term (current) drug therapy: Secondary | ICD-10-CM | POA: Diagnosis not present

## 2022-06-16 ENCOUNTER — Other Ambulatory Visit: Payer: Self-pay | Admitting: Student

## 2022-06-16 DIAGNOSIS — M545 Low back pain, unspecified: Secondary | ICD-10-CM

## 2022-06-28 DIAGNOSIS — Z79899 Other long term (current) drug therapy: Secondary | ICD-10-CM | POA: Diagnosis not present

## 2022-07-16 ENCOUNTER — Other Ambulatory Visit: Payer: Self-pay | Admitting: Student

## 2022-07-16 DIAGNOSIS — I2510 Atherosclerotic heart disease of native coronary artery without angina pectoris: Secondary | ICD-10-CM

## 2022-07-16 DIAGNOSIS — M545 Low back pain, unspecified: Secondary | ICD-10-CM

## 2022-07-16 DIAGNOSIS — I25708 Atherosclerosis of coronary artery bypass graft(s), unspecified, with other forms of angina pectoris: Secondary | ICD-10-CM

## 2022-07-24 DIAGNOSIS — Z79899 Other long term (current) drug therapy: Secondary | ICD-10-CM | POA: Diagnosis not present

## 2022-07-27 DIAGNOSIS — Z79899 Other long term (current) drug therapy: Secondary | ICD-10-CM | POA: Diagnosis not present

## 2022-08-20 ENCOUNTER — Other Ambulatory Visit: Payer: Self-pay | Admitting: Student

## 2022-08-20 DIAGNOSIS — G8929 Other chronic pain: Secondary | ICD-10-CM

## 2022-08-20 MED ORDER — DULOXETINE HCL 20 MG PO CPEP: 40.0000 mg | ORAL_CAPSULE | Freq: Every day | ORAL | 0 refills | Status: DC

## 2022-08-24 DIAGNOSIS — Z79899 Other long term (current) drug therapy: Secondary | ICD-10-CM | POA: Diagnosis not present

## 2022-08-25 ENCOUNTER — Other Ambulatory Visit: Payer: Self-pay | Admitting: Student

## 2022-08-25 DIAGNOSIS — I10 Essential (primary) hypertension: Secondary | ICD-10-CM

## 2022-08-29 DIAGNOSIS — Z79899 Other long term (current) drug therapy: Secondary | ICD-10-CM | POA: Diagnosis not present

## 2022-09-19 ENCOUNTER — Other Ambulatory Visit: Payer: Self-pay | Admitting: Student

## 2022-09-19 DIAGNOSIS — G8929 Other chronic pain: Secondary | ICD-10-CM

## 2022-09-20 ENCOUNTER — Ambulatory Visit (HOSPITAL_COMMUNITY)
Admission: RE | Admit: 2022-09-20 | Payer: Medicaid Other | Source: Ambulatory Visit | Attending: Cardiology | Admitting: Cardiology

## 2022-09-20 MED ORDER — DULOXETINE HCL 20 MG PO CPEP: 40.0000 mg | ORAL_CAPSULE | Freq: Every day | ORAL | 0 refills | Status: AC

## 2022-09-21 DIAGNOSIS — Z79899 Other long term (current) drug therapy: Secondary | ICD-10-CM | POA: Diagnosis not present

## 2022-09-28 DIAGNOSIS — Z79899 Other long term (current) drug therapy: Secondary | ICD-10-CM | POA: Diagnosis not present

## 2022-10-04 ENCOUNTER — Ambulatory Visit (HOSPITAL_COMMUNITY)
Admission: RE | Admit: 2022-10-04 | Discharge: 2022-10-04 | Disposition: A | Discharge status: Home / Self Care | Payer: Medicaid Other | Source: Ambulatory Visit | Attending: Cardiology | Admitting: Cardiology

## 2022-10-04 DIAGNOSIS — I7143 Infrarenal abdominal aortic aneurysm, without rupture: Secondary | ICD-10-CM | POA: Diagnosis not present

## 2022-10-05 ENCOUNTER — Other Ambulatory Visit: Payer: Self-pay

## 2022-10-05 DIAGNOSIS — I7143 Infrarenal abdominal aortic aneurysm, without rupture: Secondary | ICD-10-CM

## 2022-10-18 ENCOUNTER — Other Ambulatory Visit: Payer: Self-pay | Admitting: Student

## 2022-10-18 DIAGNOSIS — I25708 Atherosclerosis of coronary artery bypass graft(s), unspecified, with other forms of angina pectoris: Secondary | ICD-10-CM

## 2022-10-18 DIAGNOSIS — I2510 Atherosclerotic heart disease of native coronary artery without angina pectoris: Secondary | ICD-10-CM

## 2022-10-22 DIAGNOSIS — Z79899 Other long term (current) drug therapy: Secondary | ICD-10-CM | POA: Diagnosis not present

## 2022-10-25 DIAGNOSIS — Z79899 Other long term (current) drug therapy: Secondary | ICD-10-CM | POA: Diagnosis not present

## 2022-11-19 DIAGNOSIS — Z79899 Other long term (current) drug therapy: Secondary | ICD-10-CM | POA: Diagnosis not present

## 2022-11-21 DIAGNOSIS — Z79899 Other long term (current) drug therapy: Secondary | ICD-10-CM | POA: Diagnosis not present

## 2022-12-19 DIAGNOSIS — Z79899 Other long term (current) drug therapy: Secondary | ICD-10-CM | POA: Diagnosis not present

## 2022-12-19 DIAGNOSIS — Z0000 Encounter for general adult medical examination without abnormal findings: Secondary | ICD-10-CM | POA: Diagnosis not present

## 2022-12-19 DIAGNOSIS — I252 Old myocardial infarction: Secondary | ICD-10-CM | POA: Diagnosis not present

## 2022-12-19 DIAGNOSIS — E559 Vitamin D deficiency, unspecified: Secondary | ICD-10-CM | POA: Diagnosis not present

## 2022-12-19 DIAGNOSIS — Z125 Encounter for screening for malignant neoplasm of prostate: Secondary | ICD-10-CM | POA: Diagnosis not present

## 2022-12-24 DIAGNOSIS — Z79899 Other long term (current) drug therapy: Secondary | ICD-10-CM | POA: Diagnosis not present

## 2023-01-02 DIAGNOSIS — Z79899 Other long term (current) drug therapy: Secondary | ICD-10-CM | POA: Diagnosis not present

## 2023-01-31 ENCOUNTER — Encounter: Payer: Self-pay | Admitting: Cardiology

## 2023-01-31 ENCOUNTER — Ambulatory Visit: Payer: Medicaid Other | Attending: Cardiology

## 2023-01-31 ENCOUNTER — Ambulatory Visit: Payer: Medicaid Other | Attending: Cardiology | Admitting: Cardiology

## 2023-01-31 VITALS — BP 100/58 | HR 55 | Ht 68.5 in | Wt 250.0 lb

## 2023-01-31 DIAGNOSIS — I2510 Atherosclerotic heart disease of native coronary artery without angina pectoris: Secondary | ICD-10-CM

## 2023-01-31 DIAGNOSIS — I493 Ventricular premature depolarization: Secondary | ICD-10-CM | POA: Diagnosis not present

## 2023-01-31 DIAGNOSIS — I498 Other specified cardiac arrhythmias: Secondary | ICD-10-CM

## 2023-01-31 DIAGNOSIS — R55 Syncope and collapse: Secondary | ICD-10-CM

## 2023-01-31 DIAGNOSIS — R001 Bradycardia, unspecified: Secondary | ICD-10-CM

## 2023-01-31 DIAGNOSIS — I714 Abdominal aortic aneurysm, without rupture, unspecified: Secondary | ICD-10-CM

## 2023-01-31 DIAGNOSIS — R42 Dizziness and giddiness: Secondary | ICD-10-CM | POA: Diagnosis not present

## 2023-01-31 DIAGNOSIS — I10 Essential (primary) hypertension: Secondary | ICD-10-CM

## 2023-01-31 DIAGNOSIS — E7849 Other hyperlipidemia: Secondary | ICD-10-CM

## 2023-01-31 DIAGNOSIS — Z79899 Other long term (current) drug therapy: Secondary | ICD-10-CM | POA: Diagnosis not present

## 2023-01-31 MED ORDER — PROPRANOLOL HCL 10 MG PO TABS: 10.0000 mg | ORAL_TABLET | Freq: Three times a day (TID) | ORAL | 3 refills | Status: DC

## 2023-01-31 NOTE — Assessment & Plan Note (Signed)
Last LDL 102. Not specifically addressed today given acute symptoms. Will plan to discuss at follow up in 1 month. LDL goal should be <70. Continue Crestor 40mg .

## 2023-01-31 NOTE — Assessment & Plan Note (Signed)
As above, clinic ECG today noted frequent PVCs in bigeminy pattern.   Given orthostatic dizziness/occasional near syncope in recent weeks, will order heart monitor to assess burden. As above, change Toprol XL to Propranolol per discussion with Dr. Nelly Laurence.  Check echocardiogram Check CBC, CMP, Mg

## 2023-01-31 NOTE — Assessment & Plan Note (Signed)
Last study in March of 2024 showed the aorta is 4.6 cm and unchanged from previous. Will continue to monitor annually. Repeat study is already ordered.

## 2023-01-31 NOTE — Assessment & Plan Note (Addendum)
Patient with history of bradycardia noted to have low HR by PCP today. On ECG, patient with HR 55 and frequent PVCs in bigeminy pattern. PVCs are largely non-perfusing. Given that these are occurring while on Toprol XL 25mg , per discussion with Dr. Nelly Laurence, will try switching to alternative beta blocker.   Start Propranolol 10mg  TID. Advised patient to closely monitor HR and BP response (has home cuff and pulse oximetry device). Check echocardiogram

## 2023-01-31 NOTE — Progress Notes (Unsigned)
Applied a 14 day Zio AT monitor to patient in the office  [place] to read

## 2023-01-31 NOTE — Patient Instructions (Addendum)
Medication Instructions:  Your physician has recommended you make the following change in your medication:  STOP LISINOPRIL STOP TOPROL XL START PROPRANOLOL 10 MG THREE TIMES DAILY  *If you need a refill on your cardiac medications before your next appointment, please call your pharmacy*   Lab Work: Today: CMET, CBC, MAGNESIUM   If you have labs (blood work) drawn today and your tests are completely normal, you will receive your results only by: MyChart Message (if you have MyChart) OR A paper copy in the mail If you have any lab test that is abnormal or we need to change your treatment, we will call you to review the results.   Testing/Procedures: Your physician has requested that you have an echocardiogram. Echocardiography is a painless test that uses sound waves to create images of your heart. It provides your doctor with information about the size and shape of your heart and how well your heart's chambers and valves are working. This procedure takes approximately one hour. There are no restrictions for this procedure. Please do NOT wear cologne, perfume, aftershave, or lotions (deodorant is allowed). Please arrive 15 minutes prior to your appointment time.   ZIO AT Long term monitor-Live Telemetry  Your physician has requested you wear a ZIO patch monitor for 14 days.  This is a single patch monitor. [employer] supplies one patch monitor per enrollment. Additional  stickers are not available.  Please do not apply patch if you will be having a Nuclear Stress Test, Echocardiogram, Cardiac CT, MRI,  or Chest Xray during the period you would be wearing the monitor. The patch cannot be worn during  these tests. You cannot remove and re-apply the ZIO AT patch monitor.  Your ZIO patch monitor will be mailed 3 day USPS to your address on file. It may take 3-5 days to  receive your monitor after you have been enrolled.  Once you have received your monitor, please review the enclosed  instructions. Your monitor has  already been registered assigning a specific monitor serial # to you.   Billing and Patient Assistance Program information  George [name] has been supplied with any insurance information on record for billing. [employer] offers a sliding scale Patient Assistance Program for patients without insurance, or whose  insurance does not completely cover the cost of the ZIO patch monitor. You must apply for the  Patient Assistance Program to qualify for the discounted rate. To apply, call [employer] at [phone],  select option 4, select option 2 , ask to apply for the Patient Assistance Program, (you can request an  interpreter if needed). [employer] will ask your household income and how many people are in your  household. [employer] will quote your out-of-pocket cost based on this information. They will also be able  to set up a 12 month interest free payment plan if needed.  Starting the Gateway  In your kit there is a Audiological scientist box the size of a cellphone. This is Buyer, retail. It transmits all your  recorded data to [facility]. This box must always stay within 10 feet of you. Open the box and push the *  button. There will be a light that blinks orange and then green a few times. When the light stops  blinking, the Gateway is connected to the ZIO patch. Call [employer] at [phone] to confirm your monitor is transmitting.  Returning your monitor  Remove your patch and place it inside the Gateway. In the lower half of the Gateway there is a  white  bag with prepaid postage on it. Place Gateway in bag and seal. Mail package back to [place] as soon as  possible. Your physician should have your final report approximately 7 days after you have mailed back  your monitor. Call [facility] Customer Care at [phone] if you have questions regarding your ZIO AT  patch monitor. Call them immediately if you see an orange light blinking on your monitor.  If your monitor  falls off in less than 4 days, contact our Monitor department at [phone]. If your  monitor becomes loose or falls off after 4 days call [employer] at [phone] for suggestions on  securing your monitor    Follow-Up: At [facility], you and your health needs are our priority.  As part of our continuing mission to provide you with exceptional heart care, we have created designated Provider Care Teams.  These Care Teams include your primary Cardiologist (physician) and Advanced Practice Providers (APPs -  Physician Assistants and Nurse Practitioners) who all work together to provide you with the care you need, when you need it.  We recommend signing up for the patient portal called "MyChart".  Sign up information is provided on this After Visit Summary.  MyChart is used to connect with patients for Virtual Visits (Telemedicine).  Patients are able to view lab/test results, encounter notes, upcoming appointments, etc.  Non-urgent messages can be sent to your provider as well.   To learn more about what you can do with MyChart, go to [email or web address].    Your next appointment:   1 month(s)  Provider:   Rollene Rotunda, MD or Perlie Gold, PA-C  Other Instructions Echocardiogram An echocardiogram is a test that uses sound waves to make images of your heart. This way of making images is often called ultrasound. The images from this test can help find out many things about your heart, including: The size and shape of your heart. The strength of your heart muscle and how well it's working. The size, thickness, and movement of your heart's walls. How your heart valves are working. Problems such as: A tumor or a growth from an infection around the heart valves. Areas of heart muscle that aren't working well because of poor blood flow or injury from a heart attack. An aneurysm. This is a weak or damaged part of an artery wall. An artery is a blood vessel. Tell a health care  provider about: Any allergies you have. All medicines you're taking, including vitamins, herbs, eye drops, creams, and over-the-counter medicines. Any bleeding problems you have. Any surgeries you've had. Any medical problems you have. Whether you're pregnant or may be pregnant. What are the risks? Your health care provider will talk with you about risks. These may include an allergic reaction to IV dye that may be used during the test. What happens before the test? You don't need to do anything to get ready for this test. You may eat and drink normally. What happens during the test?  You'll take off your clothes from the waist up and put on a hospital gown. Sticky patches called electrodes may be placed on your chest. These will be connected to a machine that monitors your heart rate and rhythm. You'll lie down on a table for the exam. A wand covered in gel will be moved over your chest. Sound waves from the wand will go to your heart and bounce back--or "echo" back. The sound waves will go to a computer that uses them  to make images of your heart. The images can be viewed on a monitor. The images will also be recorded on the computer so your provider can look at them later. You may be asked to change positions or hold your breath for a short time. This makes it easier to get different views or better views of your heart. In some cases, you may be given a dye through an IV. The IV is put into one of your veins. This dye can make the areas of your heart easier to see. The procedure may vary among providers and hospitals. What can I expect after the test? You may return to your normal diet, activities, and medicines unless your provider tells you not to. If an IV was placed for the test, it will be removed. It's up to you to get the results of your test. Ask your provider, or the department that's doing the test, when your results will be ready. This information is not intended to replace  advice given to you by your health care provider. Make sure you discuss any questions you have with your health care provider. Document Revised: 08/24/2022 Document Reviewed: 08/24/2022 Elsevier Patient Education  2024 [employer].

## 2023-01-31 NOTE — Progress Notes (Signed)
Cardiology Office Note:   Date:  01/31/2023  ID:  George [name], DOB 03-17-1966, MRN [id number] PCP: Jerre Simon, MD  [facility] Providers Cardiologist:  Rollene Rotunda, MD    History of Present Illness:   George [name] is a 57 y.o. male with history of coronary artery disease (2011 NSTEMI with 99% LCX lesion, 2014 found with occluded OM at site of prior stent), bradycardia, dyslipidemia, tobacco use, abdominal aortic aneurysm, chronic pain.   Patient seen today as a same day add on after patient was seen at PCP and found to be bradycardic with low BP. He reports noticing increased orthostatic dizziness and fatigue in recent weeks but has otherwise been without cardiac symptoms such as chest pain, palpitations, shortness of breath. Per patient, PCP worried about use of pain medications with bradycardia. Dizziness is most noticeable when patient gets up from laying down or sitting. He has not passed out or fallen as a result of these symptoms.   He is remains under significant stress as a result of being the primary caregiver for his spouse who has multiple chronic health conditions as well as his father who lives nearby. Per patient, the relationship with his father is frequently a significant source of stress. He also has some stress as a result of tight finances (doesn't have A/C except in his bedroom due to inability to fix his systemic A/C). Patient is very active between housework and yard work in addition to caring for family members but admits "I don't do a good job taking care of myself." He also continues to have significant/chronic musculoskeletal pain that is at times quite debilitating.    Today patient denies chest pain, shortness of breath, lower extremity edema, fatigue, palpitations, melena, hematuria, hemoptysis, diaphoresis, weakness, syncope, orthopnea, and PND.   Studies Reviewed:    EKG:   EKG Interpretation Date/Time:  Thursday January 31 2023 11:04:56  EDT Ventricular Rate:  55 PR Interval:  162 QRS Duration:  92 QT Interval:  430 QTC Calculation: 411 R Axis:   -12  Text Interpretation: Sinus bradycardia with frequent Premature ventricular complexes in a pattern of bigeminy When compared with ECG of 15-Jan-2014 21:17, Premature ventricular complexes are now Present Confirmed by Perlie Gold [phone]) on 01/31/2023 11:12:47 AM      Risk Assessment/Calculations:              Physical Exam:   VS:  BP (!) 100/58   Pulse (!) 55   Ht 5' 8.5" (1.74 m)   Wt 250 lb (113.4 kg)   SpO2 98%   BMI 37.46 kg/m    Wt Readings from Last 3 Encounters:  01/31/23 250 lb (113.4 kg)  04/19/22 252 lb 3.2 oz (114.4 kg)  01/03/21 229 lb (103.9 kg)     Physical Exam Constitutional:      Appearance: Normal appearance.  HENT:     Head: Normocephalic.  Eyes:     Pupils: Pupils are equal, round, and reactive to light.  Cardiovascular:     Rate and Rhythm: Bradycardia present. Rhythm irregular.     Pulses: Normal pulses.     Heart sounds: Normal heart sounds.     Comments: Regularly irregular with PVCs Pulmonary:     Effort: Pulmonary effort is normal.     Breath sounds: Normal breath sounds.  Musculoskeletal:     Right lower leg: No edema.     Left lower leg: No edema.  Skin:    General: Skin is warm  and dry.     Capillary Refill: Capillary refill takes less than 2 seconds.  Neurological:     General: No focal deficit present.     Mental Status: He is alert and oriented to person, place, and time.  Psychiatric:        Mood and Affect: Mood normal.        Behavior: Behavior normal.        Thought Content: Thought content normal.        Judgment: Judgment normal.      ASSESSMENT AND PLAN:   Abdominal aortic aneurysm (AAA) (HCC) Last study in March of 2024 showed the aorta is 4.6 cm and unchanged from previous. Will continue to monitor annually. Repeat study is already ordered.  HTN (hypertension) BP low in clinic today at 100/58.  Patient was able to show me logs of BP at his PCP and most measurements are around 110-115/60-70s. Will stop Toprol XL and Lisinopril today. Add Propranolol given significant PVC burden. Patient advised to call the office for regular BP <140mmHg or worsening dizziness.  Postural dizziness with near syncope Patient with recent dizziness on standing. BP low normal today in clinic at 100/58.   Will decrease hypertension regimen by stopping Lisinopril and Toprol. Also suspect a component of dehydration given recent heat and patient's lack of A/C except in his bedroom.  Check 14 day heart monitor to assess PVC burden. Likely contributing factor if frequent. Follow up in 1 month to reassess with these changes.  Bradycardia Patient with history of bradycardia noted to have low HR by PCP today. On ECG, patient with HR 55 and frequent PVCs in bigeminy pattern. PVCs are largely non-perfusing. Given that these are occurring while on Toprol XL 25mg , per discussion with Dr. Nelly Laurence, will try switching to alternative beta blocker.   Start Propranolol 10mg  TID. Advised patient to closely monitor HR and BP response (has home cuff and pulse oximetry device). Check echocardiogram  Hyperlipemia Last LDL 102. Not specifically addressed today given acute symptoms. Will plan to discuss at follow up in 1 month. LDL goal should be <70. Continue Crestor 40mg .   CORONARY ATHEROSCLEROSIS NATIVE CORONARY ARTERY Patient with prior PCI x2, last in 2014. Denies recent anginal symptoms or exertional limitation but expresses general concern about recurrent stenosis. Overall low suspicion for changes to his coronary anatomy based on reported symptoms today. If echocardiogram shows significant changes from prior, would have low threshold to check myoview or cardiac PET.  Continue ASA 81mg  Continue Crestor 40mg  Continue Imdur 30mg   Frequent PVCs As above, clinic ECG today noted frequent PVCs in bigeminy pattern.   Given  orthostatic dizziness/occasional near syncope in recent weeks, will order heart monitor to assess burden. As above, change Toprol XL to Propranolol per discussion with Dr. Nelly Laurence.  Check echocardiogram Check CBC, CMP, Mg           Signed, Perlie Gold, PA-C

## 2023-01-31 NOTE — Telephone Encounter (Signed)
Error

## 2023-01-31 NOTE — Assessment & Plan Note (Addendum)
Patient with recent dizziness on standing. BP low normal today in clinic at 100/58.   Will decrease hypertension regimen by stopping Lisinopril and Toprol. Also suspect a component of dehydration given recent heat and patient's lack of A/C except in his bedroom.  Check 14 day heart monitor to assess PVC burden. Likely contributing factor if frequent. Follow up in 1 month to reassess with these changes.

## 2023-01-31 NOTE — Assessment & Plan Note (Signed)
BP low in clinic today at 100/58. Patient was able to show me logs of BP at his PCP and most measurements are around 110-115/60-70s. Will stop Toprol XL and Lisinopril today. Add Propranolol given significant PVC burden. Patient advised to call the office for regular BP <137mmHg or worsening dizziness.

## 2023-01-31 NOTE — Assessment & Plan Note (Signed)
Patient with prior PCI x2, last in 2014. Denies recent anginal symptoms or exertional limitation but expresses general concern about recurrent stenosis. Overall low suspicion for changes to his coronary anatomy based on reported symptoms today. If echocardiogram shows significant changes from prior, would have low threshold to check myoview or cardiac PET.  Continue ASA 81mg  Continue Crestor 40mg  Continue Imdur 30mg 

## 2023-02-01 LAB — MAGNESIUM: Magnesium: 2.3 mg/dL (ref 1.6–2.3)

## 2023-02-21 ENCOUNTER — Ambulatory Visit (HOSPITAL_COMMUNITY): Payer: Medicaid Other | Attending: Internal Medicine

## 2023-02-21 DIAGNOSIS — R001 Bradycardia, unspecified: Secondary | ICD-10-CM

## 2023-02-21 DIAGNOSIS — I498 Other specified cardiac arrhythmias: Secondary | ICD-10-CM | POA: Diagnosis not present

## 2023-02-21 LAB — ECHOCARDIOGRAM COMPLETE
Area-P 1/2: 4.06 cm2
MV M vel: 4.82 m/s
MV Peak grad: 92.9 mmHg
S' Lateral: 4.1 cm

## 2023-02-28 ENCOUNTER — Encounter: Payer: Self-pay | Admitting: Cardiology

## 2023-02-28 ENCOUNTER — Ambulatory Visit: Payer: Medicaid Other | Attending: Cardiology | Admitting: Cardiology

## 2023-02-28 VITALS — BP 114/60 | HR 56 | Ht 68.5 in | Wt 250.8 lb

## 2023-02-28 DIAGNOSIS — I493 Ventricular premature depolarization: Secondary | ICD-10-CM | POA: Diagnosis not present

## 2023-02-28 DIAGNOSIS — I498 Other specified cardiac arrhythmias: Secondary | ICD-10-CM | POA: Diagnosis not present

## 2023-02-28 DIAGNOSIS — R001 Bradycardia, unspecified: Secondary | ICD-10-CM | POA: Diagnosis not present

## 2023-02-28 DIAGNOSIS — I2510 Atherosclerotic heart disease of native coronary artery without angina pectoris: Secondary | ICD-10-CM

## 2023-02-28 MED ORDER — MEXILETINE HCL 250 MG PO CAPS: 250.0000 mg | ORAL_CAPSULE | Freq: Two times a day (BID) | ORAL | 3 refills | Status: DC

## 2023-02-28 NOTE — Patient Instructions (Signed)
Medication Instructions:  Your physician has recommended you make the following change in your medication: START Mexiletine 250 mg twice daily  *If you need a refill on your cardiac medications before your next appointment, please call your pharmacy*   Lab Work: None ordered   Testing/Procedures: None ordered   Follow-Up: At [facility], you and your health needs are our priority.  As part of our continuing mission to provide you with exceptional heart care, we have created designated Provider Care Teams.  These Care Teams include your primary Cardiologist (physician) and Advanced Practice Providers (APPs -  Physician Assistants and Nurse Practitioners) who all work together to provide you with the care you need, when you need it.   Your physician recommends that you schedule a follow-up appointment in: 2 weeks for a nurse visit EKG  (after starting Mexiletine)   Your next appointment:   3 month(s)  The format for your next appointment:   In Person  Provider:   Loman Brooklyn, MD    Thank you for choosing [facility]!!   Dory Horn, RN [phone]  Other Instructions  Mexiletine Capsules What is this medication? MEXILETINE (mex [place] e teen) treats a fast or irregular heartbeat (arrhythmia). It works by slowing down overactive electric signals in the heart, which stabilizes your heart rhythm. It belongs to a group of medications called antiarrhythmics. This medicine may be used for other purposes; ask your health care provider or pharmacist if you have questions. COMMON BRAND NAME(S): Mexitil What should I tell my care team before I take this medication? They need to know if you have any of these conditions: Liver disease Other heart problems Previous heart attack An unusual or allergic reaction to mexiletine, other medications, foods, dyes, or preservatives Pregnant or trying to get pregnant Breast-feeding How should I use this medication? Take this  medication by mouth with a glass of water. Follow the directions on the prescription label. It is recommended that you take this medication with food or an antacid. Take your doses at regular intervals. Do not take your medication more often than directed. Do not stop taking except on the advice of your care team. Talk to your care team about the use of this medication in children. Special care may be needed. Overdosage: If you think you have taken too much of this medicine contact a poison control center or emergency room at once. NOTE: This medicine is only for you. Do not share this medicine with others. What if I miss a dose? If you miss a dose, take it as soon as you can. If it is almost time for your next dose, take only that dose. Do not take double or extra doses. What may interact with this medication? Do not take this medication with any of the following: Dofetilide This medication may also interact with the following: Caffeine Cimetidine Medications for depression, anxiety, or psychotic disturbances Medications to control heart rhythm Phenobarbital Phenytoin Rifampin Theophylline This list may not describe all possible interactions. Give your health care provider a list of all the medicines, herbs, non-prescription drugs, or dietary supplements you use. Also tell them if you smoke, drink alcohol, or use illegal drugs. Some items may interact with your medicine. What should I watch for while using this medication? Your condition will be monitored closely when you first begin therapy. Often, this medication is first started in a hospital or other monitored health care setting. Once you are on maintenance therapy, visit your care team for  regular checks on your progress. Because your condition and use of this medication carry some risk, it is a good idea to carry an identification card, necklace or bracelet with details of your condition, medications, and care team. You may get drowsy or  dizzy. Do not drive, use machinery, or do anything that needs mental alertness until you know how this medication affects you. Do not stand or sit up quickly, especially if you are an older patient. This reduces the risk of dizzy or fainting spells. Alcohol can make you more dizzy, increase flushing and rapid heartbeats. Avoid alcoholic drinks. This medication may cause serious skin reactions. They can happen weeks to months after starting the medication. Contact your care team right away if you notice fevers or flu-like symptoms with a rash. The rash may be red or purple and then turn into blisters or peeling of the skin. Or, you might notice a red rash with swelling of the face, lips or lymph nodes in your neck or under your arms. What side effects may I notice from receiving this medication? Side effects that you should report to your care team as soon as possible: Allergic reactions--skin rash, itching, hives, swelling of the face, lips, tongue, or throat Heart rhythm changes--fast or irregular heartbeat, dizziness, feeling faint or lightheaded, chest pain, trouble breathing Infection--fever, chills, cough, or sore throat Liver injury--right upper belly pain, loss of appetite, nausea, light-colored stool, dark yellow or brown urine, yellowing skin or eyes, unusual weakness or fatigue Rash, fever, and swollen lymph nodes Seizures Unusual bruising or bleeding Side effects that usually do not require medical attention (report to your care team if they continue or are bothersome): Anxiety, nervousness Blurry vision Dizziness Headache Heartburn Nausea Tremors or shaking Vomiting This list may not describe all possible side effects. Call your doctor for medical advice about side effects. You may report side effects to FDA at 1-800-FDA-1088. Where should I keep my medication? Keep out of reach of children. Store at room temperature between 15 and 30 degrees C (59 and 86 degrees F). Throw away any  unused medication after the expiration date. NOTE: This sheet is a summary. It may not cover all possible information. If you have questions about this medicine, talk to your doctor, pharmacist, or health care provider.  2024 Elsevier/Gold Standard (2021-05-05 00:00:00)

## 2023-02-28 NOTE — Progress Notes (Signed)
Electrophysiology Office Note:   Date:  02/28/2023  ID:  George [name], DOB [birth year], MRN [id number]  Primary Cardiologist: Rollene Rotunda, MD Electrophysiologist: None      History of Present Illness:   George [name] is a 57 y.o. male with h/o coronary artery disease, PVCs seen today for  for Electrophysiology evaluation of PVCs at the request of Perlie Gold.   He has symptoms of fatigue and shortness of breath.  He is able to do most of his daily activities, but has to do them more slowly and has to take breaks during activity.  He is under quite a bit of stress currently, taking care of his father and wife.  His wife has psychiatric issues and his father has chronic medical problems.    He has a history of coronary artery disease post non-STEMI with 99% circumflex lesion and occluded OM at the site of a prior stent, hyperlipidemia, tobacco abuse, abdominal aortic aneurysm, PVCs.  He was found to be bradycardic by his primary physician.  He was noticing increased orthostatic dizziness and fatigue.  He wore a cardiac monitor that showed a 40% PVC burden.      Review of systems complete and found to be negative unless listed in HPI.   EP Information / Studies Reviewed:    EKG is ordered today. Personal review as below.  EKG Interpretation Date/Time:  Thursday February 28 2023 13:56:25 EDT Ventricular Rate:  56 PR Interval:  168 QRS Duration:  94 QT Interval:  440 QTC Calculation: 424 R Axis:   -13  Text Interpretation: Sinus bradycardia with frequent Premature ventricular complexes in a pattern of bigeminy When compared with ECG of 31-Jan-2023 11:04, No significant change was found Confirmed by [employer], Kelcey Wickstrom ([id number]) on 02/28/2023 2:05:21 PM     Risk Assessment/Calculations:              Physical Exam:   VS:  BP 114/60 (BP Location: Left Arm, Patient Position: Sitting, Cuff Size: Large)   Pulse (!) 56   Ht 5' 8.5" (1.74 m)   Wt 250 lb 12.8 oz (113.8 kg)   SpO2 97%   BMI  37.58 kg/m    Wt Readings from Last 3 Encounters:  02/28/23 250 lb 12.8 oz (113.8 kg)  01/31/23 250 lb (113.4 kg)  04/19/22 252 lb 3.2 oz (114.4 kg)     GEN: Well nourished, well developed in no acute distress NECK: No JVD; No carotid bruits CARDIAC: Regular rate and rhythm with occasional ectopy, no murmurs, rubs, gallops RESPIRATORY:  Clear to auscultation without rales, wheezing or rhonchi  ABDOMEN: Soft, non-tender, non-distended EXTREMITIES:  No edema; No deformity   ASSESSMENT AND PLAN:    1.  PVCs: Elevated burden at 40%.  He feels poorly with fatigue and shortness of breath.  We discussed medication management versus ablation for his PVCs.  At this point, due to his severe back issues, he would prefer to avoid procedures.  George [name] start him on mexiletine 250 mg twice daily.  EKG in 2 weeks.  If he does not tolerate this, or has further issues, amiodarone or ablation would be reasonable.  I did offer sotalol, he does not feel that he can be away from his family for inpatient loading as he is the primary caretaker.  2.  Hypertension: Currently well-controlled  3.  Coronary artery disease: No current chest pain.  Continue management per primary cardiology  4.  Hyperlipidemia: Continue Crestor per primary cardiology  Follow up with  Dr. Elberta Fortis in 3 months  Signed, Sonali Wivell Jorja Loa, MD

## 2023-03-01 DIAGNOSIS — Z79899 Other long term (current) drug therapy: Secondary | ICD-10-CM | POA: Diagnosis not present

## 2023-03-05 ENCOUNTER — Ambulatory Visit: Payer: Medicaid Other | Admitting: Cardiology

## 2023-03-08 ENCOUNTER — Other Ambulatory Visit: Payer: Self-pay

## 2023-03-08 ENCOUNTER — Encounter (HOSPITAL_COMMUNITY): Payer: Self-pay

## 2023-03-08 ENCOUNTER — Emergency Department (HOSPITAL_COMMUNITY): Payer: Medicaid Other

## 2023-03-08 ENCOUNTER — Emergency Department (HOSPITAL_COMMUNITY)
Admission: EM | Admit: 2023-03-08 | Discharge: 2023-03-08 | Disposition: A | Discharge status: Home / Self Care | Payer: Medicaid Other | Source: Home / Self Care | Attending: Emergency Medicine | Admitting: Emergency Medicine

## 2023-03-08 DIAGNOSIS — R008 Other abnormalities of heart beat: Secondary | ICD-10-CM | POA: Diagnosis present

## 2023-03-08 DIAGNOSIS — F17290 Nicotine dependence, other tobacco product, uncomplicated: Secondary | ICD-10-CM | POA: Diagnosis not present

## 2023-03-08 DIAGNOSIS — I2510 Atherosclerotic heart disease of native coronary artery without angina pectoris: Secondary | ICD-10-CM | POA: Diagnosis not present

## 2023-03-08 DIAGNOSIS — R531 Weakness: Secondary | ICD-10-CM | POA: Diagnosis not present

## 2023-03-08 DIAGNOSIS — Z7982 Long term (current) use of aspirin: Secondary | ICD-10-CM | POA: Diagnosis not present

## 2023-03-08 DIAGNOSIS — R61 Generalized hyperhidrosis: Secondary | ICD-10-CM | POA: Diagnosis not present

## 2023-03-08 DIAGNOSIS — R5383 Other fatigue: Secondary | ICD-10-CM

## 2023-03-08 DIAGNOSIS — R001 Bradycardia, unspecified: Secondary | ICD-10-CM | POA: Insufficient documentation

## 2023-03-08 DIAGNOSIS — I498 Other specified cardiac arrhythmias: Secondary | ICD-10-CM

## 2023-03-08 DIAGNOSIS — R0609 Other forms of dyspnea: Secondary | ICD-10-CM

## 2023-03-08 DIAGNOSIS — I491 Atrial premature depolarization: Secondary | ICD-10-CM | POA: Diagnosis not present

## 2023-03-08 DIAGNOSIS — R0602 Shortness of breath: Secondary | ICD-10-CM | POA: Diagnosis not present

## 2023-03-08 DIAGNOSIS — R002 Palpitations: Secondary | ICD-10-CM | POA: Diagnosis not present

## 2023-03-08 LAB — I-STAT VENOUS BLOOD GAS, ED
Acid-base deficit: 1 mmol/L (ref 0.0–2.0)
Bicarbonate: 24.1 mmol/L (ref 20.0–28.0)
Calcium, Ion: 1.19 mmol/L (ref 1.15–1.40)
HCT: 41 % (ref 39.0–52.0)
Hemoglobin: 13.9 g/dL (ref 13.0–17.0)
O2 Saturation: 65 %
Potassium: 4.4 mmol/L (ref 3.5–5.1)
Sodium: 138 mmol/L (ref 135–145)
TCO2: 25 mmol/L (ref 22–32)
pCO2, Ven: 42 mmHg — ABNORMAL LOW (ref 44–60)
pH, Ven: 7.367 (ref 7.25–7.43)
pO2, Ven: 35 mmHg (ref 32–45)

## 2023-03-08 LAB — TROPONIN I (HIGH SENSITIVITY)
Troponin I (High Sensitivity): 6 ng/L (ref <18)
Troponin I (High Sensitivity): 6 ng/L (ref <18)

## 2023-03-08 LAB — CBC
HCT: 42.1 % (ref 39.0–52.0)
Hemoglobin: 14.1 g/dL (ref 13.0–17.0)
MCH: 28.5 pg (ref 26.0–34.0)
MCHC: 33.5 g/dL (ref 30.0–36.0)
MCV: 85.2 fL (ref 80.0–100.0)
Platelets: 209 10*3/uL (ref 150–400)
RBC: 4.94 MIL/uL (ref 4.22–5.81)
RDW: 11.9 % (ref 11.5–15.5)
WBC: 10 10*3/uL (ref 4.0–10.5)
nRBC: 0 % (ref 0.0–0.2)

## 2023-03-08 LAB — BASIC METABOLIC PANEL
Anion gap: 13 (ref 5–15)
BUN: 11 mg/dL (ref 6–20)
CO2: 24 mmol/L (ref 22–32)
Calcium: 9.2 mg/dL (ref 8.9–10.3)
Chloride: 101 mmol/L (ref 98–111)
Creatinine, Ser: 1.31 mg/dL — ABNORMAL HIGH (ref 0.61–1.24)
GFR, Estimated: >60 mL/min (ref >60)
Glucose, Bld: 120 mg/dL — ABNORMAL HIGH (ref 70–99)
Potassium: 4.3 mmol/L (ref 3.5–5.1)
Sodium: 138 mmol/L (ref 135–145)

## 2023-03-08 MED ORDER — MEXILETINE HCL 250 MG PO CAPS: 250.0000 mg | ORAL_CAPSULE | Freq: Three times a day (TID) | ORAL | Status: DC

## 2023-03-08 MED ORDER — SODIUM CHLORIDE 0.9 % IV BOLUS
1000.0000 mL | Freq: Once | INTRAVENOUS | Status: AC
  Administered 2023-03-08: 1000 mL via INTRAVENOUS
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
## 2023-03-08 NOTE — Progress Notes (Signed)
   Pt presented with SOB and palpitations, chronic component.   Episode this am associated with diaphoresis, which occurred frequently while wearing recent monitor which showed PVCs 40% and frequent periods of bigeminy.    Pt states he actually felt better for the first few days after starting mexitil.   Potassium4.4 (08/30 1305) Creatinine, ser  1.31* (08/30 1210) PLT  209 (08/30 1210) HGB  13.9 (08/30 1305) WBC 10.0 (08/30 1210) Troponin I (High Sensitivity)6 (08/30 1210).    Suspect pt had prolonged episode of bigeminy this am, consistent with EKG on arrival. He is currently in trigeminy.  Discussed case with Dr. Elberta Fortis, will increase Mexitil to 250 mg BID, especially since he has had some relief.   Keep appt 9/5.  If feeling the same or worse, can transition to amiodarone, but he understands our preference would be given his age that only be done as a bridge to ablation consideration.   Reviewed with patient at length and with Dr. Wallace Cullens in the ED.   Pt stable for discharge from a cardiology perspective with close follow up as planned next week.   Casimiro Needle [address] [place], PA-C  03/08/2023 3:24 PM

## 2023-03-08 NOTE — Discharge Instructions (Signed)
Please follow up with cardiology at your next appointment  Please increase dose of mexiletine as directed by cardiology and on your paperwork today  It was a pleasure caring for you today in the emergency department.  Please return to the emergency department for any worsening or worrisome symptoms.

## 2023-03-08 NOTE — ED Triage Notes (Signed)
Patient arrives by EMS from home with c/o shortness of breath and palpitations, that began this morning after waking up.   Patient also reports started sweating this morning.  Patient reports was put on Mexiletine for his PVCs 2 weeks ago.   EMS VITALS HR 58 (Patient has history of bradycardia) 133/74 97% on RA CBG 88 last ate a sandwich at 0300.

## 2023-03-12 ENCOUNTER — Telehealth: Payer: Self-pay | Admitting: Cardiology

## 2023-03-12 NOTE — Telephone Encounter (Signed)
Left message to call back  

## 2023-03-12 NOTE — Telephone Encounter (Signed)
Pt c/o medication issue:  1. Name of Medication: Mmexiletine  2. How are you currently taking this medication (dosage and times per day)? Was taking it 4 times a day   3. Are you having a reaction (difficulty breathing--STAT)?   4. What is your medication issue? Started on it last week- he have been  sweating, trembling and palpitations not having them at this time-

## 2023-03-14 ENCOUNTER — Ambulatory Visit: Payer: Medicaid Other | Attending: Cardiovascular Disease

## 2023-03-14 VITALS — BP 110/72 | HR 56 | Resp 18 | Wt 243.8 lb

## 2023-03-14 DIAGNOSIS — I493 Ventricular premature depolarization: Secondary | ICD-10-CM | POA: Diagnosis not present

## 2023-03-14 NOTE — Progress Notes (Signed)
   Nurse Visit   Date of Encounter: 03/14/2023 ID: George [name], DOB Jan 16, 1966, MRN [id number]  PCP:  Jerre Simon, MD    [facility] Providers Cardiologist:  Rollene Rotunda, MD Electrophysiologist:  Regan Lemming, MD      Visit Details   VS:  BP 110/72 (BP Location: Left Arm, Patient Position: Sitting, Cuff Size: Large)   Pulse (!) 56   Resp 18   Wt 243 lb 12.8 oz (110.6 kg)   BMI 34.98 kg/m  , BMI Body mass index is 34.98 kg/m.  Wt Readings from Last 3 Encounters:  03/14/23 243 lb 12.8 oz (110.6 kg)  03/08/23 251 lb (113.9 kg)  02/28/23 250 lb 12.8 oz (113.8 kg)     Reason for visit: EKG Performed today: Vitals, EKG, Provider consulted:Dr. Nahser, and Education Changes (medications, testing, etc.) : No changes Length of Visit: 40 minutes   Patient started on mexiletine 250 mg twice daily on 02/28/23 by Dr Elberta Fortis, at ED visit on 03/08/23 still in bigeminy, [place] increased to three times daily, patient states this still didn't help his shortness of breath symptom and cold sweats so patient increased himself to taking mexiletine 250 mg every 6 hours on 03/09/23. Patient states that has helped him finally and even started getting up at 3 am to not miss a dose. After consultation with Dr Lalla Brothers in office, instructed patient not to exceed 3 doses per day due to toxicity. Patient verbalized understanding of following A. Tillery's prescribed order of mexiletine 250 mg three times daily but concerned he would need another visit to the ED. Instructed patient would forward concern to Dr Elberta Fortis for further recommendation and to continue on three times daily dosing.   Patient currently asymptomatic in office.  Medications Adjustments/Labs and Tests Ordered: Orders Placed This Encounter  Procedures   EKG 12-Lead   No orders of the defined types were placed in this encounter.    Signed, Festus Holts, RN  03/14/2023 2:56 PM

## 2023-03-15 NOTE — Telephone Encounter (Signed)
Pt reports still having a lot of PVCs/symptoms. The current medication, Mexiletine, does start to help/slow down things after about 45 min to an hour. Then about 6-6 1/2 hours later "things flare back up again". Aware we are still awaiting Dr. Elberta Fortis advisement. Informed that I would message MD to try and look at this in between hospital cases. Pt aware to go to ED if needed, but he states he will try and wait for MD to respond. He is currently just sitting at home trying not to stress and staying away from caffeine. He is agreeable to plan and will await my call.

## 2023-03-15 NOTE — Telephone Encounter (Signed)
Spoke to MD. Informed pt of options --       Sotalol adx/ablation vs       Amiodarone.  Pt asking if he can stay on Mexiletine and just schedule ablation for now, aiming for October/November ablation when more hospital dates are given) Aware I will discuss w/ MD and let him know. Pt agreeable to plan.

## 2023-03-18 ENCOUNTER — Encounter: Payer: Self-pay | Admitting: Cardiology

## 2023-03-18 ENCOUNTER — Other Ambulatory Visit: Payer: Self-pay | Admitting: Student

## 2023-03-19 MED ORDER — MEXILETINE HCL 250 MG PO CAPS: 250.0000 mg | ORAL_CAPSULE | Freq: Three times a day (TID) | ORAL | Status: DC

## 2023-03-21 MED ORDER — MEXILETINE HCL 250 MG PO CAPS: 250.0000 mg | ORAL_CAPSULE | Freq: Three times a day (TID) | ORAL | 2 refills | Status: DC

## 2023-03-21 NOTE — Telephone Encounter (Signed)
Pt requesting Rx be sent to [facility]. Done.  Pt aware scheduler will schedule procedure for next month and we would be in touch soon w/ instructions. Patient verbalized understanding and agreeable to plan.

## 2023-03-26 ENCOUNTER — Telehealth: Payer: Self-pay

## 2023-03-26 DIAGNOSIS — I493 Ventricular premature depolarization: Secondary | ICD-10-CM

## 2023-03-26 NOTE — Telephone Encounter (Signed)
Pt is scheduled for PVC Ablation on 10/17 at 10:00 AM with Dr. Elberta Fortis. He will come to [facility] on 10/3 for labs.  I will send Instruction letter via MyChart per pt's request.  He will need to hold Propranolol and Mexiletine for 2 days per Dr. Elberta Fortis.

## 2023-04-01 DIAGNOSIS — Z79899 Other long term (current) drug therapy: Secondary | ICD-10-CM | POA: Diagnosis not present

## 2023-04-11 ENCOUNTER — Ambulatory Visit: Payer: Medicaid Other | Attending: Student

## 2023-04-15 ENCOUNTER — Ambulatory Visit: Payer: Medicaid Other | Attending: Student

## 2023-04-15 DIAGNOSIS — I493 Ventricular premature depolarization: Secondary | ICD-10-CM | POA: Diagnosis not present

## 2023-04-16 LAB — BASIC METABOLIC PANEL
BUN/Creatinine Ratio: 9 (ref 9–20)
BUN: 12 mg/dL (ref 6–24)
CO2: 24 mmol/L (ref 20–29)
Calcium: 9.3 mg/dL (ref 8.7–10.2)
Chloride: 103 mmol/L (ref 96–106)
Creatinine, Ser: 1.28 mg/dL — ABNORMAL HIGH (ref 0.76–1.27)
Glucose: 86 mg/dL (ref 70–99)
Potassium: 4.3 mmol/L (ref 3.5–5.2)
Sodium: 140 mmol/L (ref 134–144)
eGFR: 64 mL/min/{1.73_m2} (ref >59)

## 2023-04-16 LAB — CBC
Hematocrit: 41.8 % (ref 37.5–51.0)
Hemoglobin: 14 g/dL (ref 13.0–17.7)
MCH: 29.4 pg (ref 26.6–33.0)
MCHC: 33.5 g/dL (ref 31.5–35.7)
MCV: 88 fL (ref 79–97)
Platelets: 228 10*3/uL (ref 150–450)
RBC: 4.76 x10E6/uL (ref 4.14–5.80)
RDW: 12.5 % (ref 11.6–15.4)
WBC: 7.3 10*3/uL (ref 3.4–10.8)

## 2023-04-17 ENCOUNTER — Encounter: Payer: Self-pay | Admitting: Cardiology

## 2023-04-17 ENCOUNTER — Telehealth: Payer: Medicaid Other | Admitting: Cardiology

## 2023-04-17 DIAGNOSIS — I493 Ventricular premature depolarization: Secondary | ICD-10-CM | POA: Diagnosis not present
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
## 2023-04-24 NOTE — Pre-Procedure Instructions (Signed)
Instructed patient on the following items: Arrival time 0730 Nothing to eat or drink after midnight No meds AM of procedure Responsible person to drive you home and stay with you for 24 hrs

## 2023-04-25 ENCOUNTER — Ambulatory Visit (HOSPITAL_COMMUNITY): Payer: Self-pay | Admitting: Certified Registered"

## 2023-04-25 ENCOUNTER — Encounter (HOSPITAL_COMMUNITY)
Admission: RE | Disposition: A | Discharge status: Home / Self Care | Payer: Self-pay | Source: Home / Self Care | Attending: Cardiology

## 2023-04-25 ENCOUNTER — Other Ambulatory Visit: Payer: Self-pay

## 2023-04-25 ENCOUNTER — Ambulatory Visit (HOSPITAL_COMMUNITY)
Admission: RE | Admit: 2023-04-25 | Discharge: 2023-04-25 | Disposition: A | Discharge status: Home / Self Care | Payer: Medicaid Other | Attending: Cardiology | Admitting: Cardiology

## 2023-04-25 ENCOUNTER — Encounter (HOSPITAL_COMMUNITY): Payer: Self-pay | Admitting: Cardiology

## 2023-04-25 DIAGNOSIS — R5383 Other fatigue: Secondary | ICD-10-CM | POA: Diagnosis not present

## 2023-04-25 DIAGNOSIS — I493 Ventricular premature depolarization: Secondary | ICD-10-CM

## 2023-04-25 DIAGNOSIS — R0602 Shortness of breath: Secondary | ICD-10-CM | POA: Diagnosis not present

## 2023-04-25 DIAGNOSIS — I2510 Atherosclerotic heart disease of native coronary artery without angina pectoris: Secondary | ICD-10-CM | POA: Diagnosis not present

## 2023-04-25 HISTORY — PX: PVC ABLATION: EP1236

## 2023-04-25 SURGERY — PVC ABLATION
Anesthesia: Monitor Anesthesia Care

## 2023-04-25 MED ORDER — ISOPROTERENOL HCL 0.2 MG/ML IJ SOLN
INTRAMUSCULAR | Status: AC
  Filled 2023-04-25: qty 5

## 2023-04-25 MED ORDER — OXYCODONE HCL 5 MG/5ML PO SOLN: 5.0000 mg | Freq: Once | ORAL | Status: AC | PRN

## 2023-04-25 MED ORDER — AMISULPRIDE (ANTIEMETIC) 5 MG/2ML IV SOLN: 10.0000 mg | Freq: Once | INTRAVENOUS | Status: DC | PRN

## 2023-04-25 MED ORDER — MIDAZOLAM HCL 2 MG/2ML IJ SOLN
INTRAMUSCULAR | Status: AC
  Filled 2023-04-25: qty 2

## 2023-04-25 MED ORDER — FENTANYL CITRATE (PF) 100 MCG/2ML IJ SOLN: 25.0000 ug | INTRAMUSCULAR | Status: DC | PRN

## 2023-04-25 MED ORDER — FENTANYL CITRATE (PF) 250 MCG/5ML IJ SOLN
INTRAMUSCULAR | Status: AC
  Filled 2023-04-25: qty 5

## 2023-04-25 MED ORDER — OXYCODONE HCL 5 MG PO TABS
5.0000 mg | ORAL_TABLET | Freq: Once | ORAL | Status: AC | PRN
  Administered 2023-04-25: 5 mg via ORAL

## 2023-04-25 MED ORDER — PROPOFOL 500 MG/50ML IV EMUL
INTRAVENOUS | Status: DC | PRN
  Administered 2023-04-25: 125 ug/kg/min via INTRAVENOUS

## 2023-04-25 MED ORDER — ONDANSETRON HCL 4 MG/2ML IJ SOLN: 4.0000 mg | Freq: Once | INTRAMUSCULAR | Status: DC | PRN

## 2023-04-25 MED ORDER — MIDAZOLAM HCL 2 MG/2ML IJ SOLN
INTRAMUSCULAR | Status: DC | PRN
  Administered 2023-04-25: 2 mg via INTRAVENOUS

## 2023-04-25 MED ORDER — OXYCODONE-ACETAMINOPHEN 5-325 MG PO TABS
ORAL_TABLET | ORAL | Status: AC
  Filled 2023-04-25: qty 1

## 2023-04-25 MED ORDER — ISOPROTERENOL HCL 0.2 MG/ML IJ SOLN
INTRAVENOUS | Status: DC | PRN
  Administered 2023-04-25: 2 ug/min via INTRAVENOUS

## 2023-04-25 MED ORDER — OXYCODONE HCL 5 MG PO TABS
ORAL_TABLET | ORAL | Status: AC
  Filled 2023-04-25: qty 1

## 2023-04-25 MED ORDER — SODIUM CHLORIDE 0.9 % IV SOLN: INTRAVENOUS | Status: DC

## 2023-04-25 SURGICAL SUPPLY — 7 items
PACK EP LATEX FREE (CUSTOM PROCEDURE TRAY)
PACK EP LF (CUSTOM PROCEDURE TRAY) ×1 IMPLANT
PAD DEFIB RADIO PHYSIO CONN (PAD) ×1 IMPLANT
PATCH CARTO3 (PAD) IMPLANT
SHEATH PINNACLE 6F 10CM (SHEATH) IMPLANT
SHEATH PINNACLE 7F 10CM (SHEATH) IMPLANT
SHEATH PINNACLE 8F 10CM (SHEATH) IMPLANT

## 2023-04-25 NOTE — Anesthesia Preprocedure Evaluation (Addendum)
Anesthesia Evaluation  Patient identified by MRN, date of birth, ID band Patient awake    Reviewed: Allergy & Precautions, H&P , NPO status , Patient's Chart, lab work & pertinent test results, reviewed documented beta blocker date and time   Airway Mallampati: II  TM Distance: >3 FB Neck ROM: Full    Dental  (+) Edentulous Upper, Missing, Poor Dentition, Dental Advisory Given, Chipped,    Pulmonary Current Smoker vaping   Pulmonary exam normal breath sounds clear to auscultation       Cardiovascular hypertension (137/73 preop), Pt. on medications and Pt. on home beta blockers + CAD, + Past MI (2012 NSTEMI, 2014) and + Cardiac Stents (2012 DES LCX)  Normal cardiovascular exam+ dysrhythmias (PVCs) + Valvular Problems/Murmurs (mild MR) MR  Rhythm:Regular Rate:Normal  Echo 02/2023  1. HR 40s to 60s during study Frequent PVCs. Left ventricular ejection  fraction, by estimation, is 55 to 60%. The left ventricle has normal  function. The left ventricle has no regional wall motion abnormalities.  The left ventricular internal cavity size   was severely dilated. Left ventricular diastolic parameters were normal.   2. Right ventricular systolic function is normal. The right ventricular  size is normal.   3. Left atrial size was mildly dilated.   4. The mitral valve is normal in structure. Mild mitral valve  regurgitation.   5. The aortic valve is tricuspid. Aortic valve regurgitation is not  visualized.   6. The inferior vena cava is normal in size with <50% respiratory  variability, suggesting right atrial pressure of 8 mmHg.     Neuro/Psych  PSYCHIATRIC DISORDERS Anxiety     negative neurological ROS     GI/Hepatic Neg liver ROS,GERD  Controlled,,  Endo/Other  Obesity BMI 37  Renal/GU negative Renal ROS  negative genitourinary   Musculoskeletal  (+) Arthritis , Osteoarthritis,    Abdominal  (+) + obese  Peds negative  pediatric ROS (+)  Hematology negative hematology ROS (+)   Anesthesia Other Findings   Reproductive/Obstetrics negative OB ROS                             Anesthesia Physical Anesthesia Plan  ASA: 2  Anesthesia Plan: MAC   Post-op Pain Management:    Induction: Intravenous  PONV Risk Score and Plan: Ondansetron, Dexamethasone, Midazolam and Treatment may vary due to age or medical condition  Airway Management Planned: Natural Airway and Simple Face Mask  Additional Equipment: None  Intra-op Plan:   Post-operative Plan:   Informed Consent: I have reviewed the patients History and Physical, chart, labs and discussed the procedure including the risks, benefits and alternatives for the proposed anesthesia with the patient or authorized representative who has indicated his/her understanding and acceptance.     Dental advisory given  Plan Discussed with: CRNA  Anesthesia Plan Comments:        Anesthesia Quick Evaluation

## 2023-04-25 NOTE — Transfer of Care (Signed)
Immediate Anesthesia Transfer of Care Note  Patient: George [name]  Procedure(s) Performed: PVC ABLATION  Patient Location: Cath Lab  Anesthesia Type:MAC  Level of Consciousness: drowsy and patient cooperative  Airway & Oxygen Therapy: Patient Spontanous Breathing  Post-op Assessment: Report given to RN and Post -op Vital signs reviewed and stable  Post vital signs: Reviewed and stable  Last Vitals:  Vitals Value Taken Time  BP 126/80 04/25/23 1425  Temp 36.7 C 04/25/23 1400  Pulse 49 04/25/23 1426  Resp 7 04/25/23 1426  SpO2 97 % 04/25/23 1426  Vitals shown include unfiled device data.  Last Pain:  Vitals:   04/25/23 1410  TempSrc:   PainSc: 0-No pain      Patients Stated Pain Goal: 2 (04/25/23 0753)  Complications: There were no known notable events for this encounter.

## 2023-04-25 NOTE — Progress Notes (Signed)
Patient brought into the EP lab.  Was initially planned for PVC ablation.  Patient was on propofol, but this was stopped.  He had a low burden of PVCs.  Isoproterenol was started at 6 mcg/kg/h with again no PVCs noted.  Due to a lack of PVCs, the case was canceled.  Loman Brooklyn, MD

## 2023-04-25 NOTE — Interval H&P Note (Signed)
History and Physical Interval Note:  04/25/2023 10:15 AM  George [name]  has presented today for surgery, with the diagnosis of pvc's.  The various methods of treatment have been discussed with the patient and family. After consideration of risks, benefits and other options for treatment, the patient has consented to  Procedure(s): PVC ABLATION (N/A) as a surgical intervention.  The patient's history has been reviewed, patient examined, no change in status, stable for surgery.  I have reviewed the patient's chart and labs.  Questions were answered to the patient's satisfaction.     Puja Caffey [employer]

## 2023-04-25 NOTE — Anesthesia Postprocedure Evaluation (Signed)
Anesthesia Post Note  Patient: George [name]  Procedure(s) Performed: PVC ABLATION     Patient location during evaluation: PACU Anesthesia Type: MAC Level of consciousness: awake and alert Pain management: pain level controlled Vital Signs Assessment: post-procedure vital signs reviewed and stable Respiratory status: spontaneous breathing, nonlabored ventilation, respiratory function stable and patient connected to nasal cannula oxygen Cardiovascular status: stable and blood pressure returned to baseline Postop Assessment: no apparent nausea or vomiting Anesthetic complications: no   There were no known notable events for this encounter.  Last Vitals:  Vitals:   04/25/23 1425 04/25/23 1434  BP: 126/80 (!) 137/95  Pulse: (!) 47 (!) 48  Resp: 12 13  Temp: 36.7 C   SpO2: 98% 98%    Last Pain:  Vitals:   04/25/23 1434  TempSrc:   PainSc: 3                  [name]

## 2023-04-26 ENCOUNTER — Other Ambulatory Visit: Payer: Self-pay | Admitting: Student

## 2023-04-26 DIAGNOSIS — I2510 Atherosclerotic heart disease of native coronary artery without angina pectoris: Secondary | ICD-10-CM

## 2023-04-29 NOTE — Plan of Care (Signed)
[facility] Tonsillectomy/Adenoidectomy, Postoperative PEDS care plan entered in error.

## 2023-04-30 DIAGNOSIS — E7800 Pure hypercholesterolemia, unspecified: Secondary | ICD-10-CM | POA: Diagnosis not present

## 2023-04-30 DIAGNOSIS — Z79899 Other long term (current) drug therapy: Secondary | ICD-10-CM | POA: Diagnosis not present

## 2023-05-09 ENCOUNTER — Telehealth: Payer: Self-pay | Admitting: *Deleted

## 2023-05-09 NOTE — Telephone Encounter (Signed)
Left message to call back  (2 week zio for pvc burden, follow up appt after monitor completed)

## 2023-05-22 ENCOUNTER — Encounter: Payer: Self-pay | Admitting: Cardiology

## 2023-05-22 NOTE — Progress Notes (Deleted)
  Electrophysiology Office Note:   Date:  05/22/2023  ID:  George [name], DOB 06-13-1966, MRN [id number]  Primary Cardiologist: Rollene Rotunda, MD Electrophysiologist: Will Jorja Loa, MD  {Click to update primary MD,subspecialty MD or APP then REFRESH:1}    History of Present Illness:   George [name] is a 57 y.o. male with h/o PVC's s/p ablation, HTN, AAA, CAD s/p MI, obesity, HLD & tobacco abuse seen today for routine electrophysiology followup.   Pt was identified to have ~40% PVC burden on cardiac monitor in 02/2023. He was seen in ER on 03/07/33 with SOB, palpitations and mexiletine was increased to 250 mg BID. In 03/2023 the patient called the clinic with concerns for increasing symptoms despite mexiletine.  He was planned 04/25/23 for PVC ablation.  However, in EP lab, he was started on propofol which significantly reduced his PVC burden. Propofol was stopped. Isoproterenol was started but unable to induce PVC's and case was canceled.   Since last being seen in our clinic the patient reports doing ***.    He denies chest pain, palpitations, dyspnea, PND, orthopnea, nausea, vomiting, dizziness, syncope, edema, weight gain, or early satiety.   Review of systems complete and found to be negative unless listed in HPI.   EP Information / Studies Reviewed:    EKG is ordered today. Personal review as below.      Studies:  Stress Test 12/2020 > LVEF 46%, low risk study, no evidence of ischemia  ECHO 02/2023 > LVEF 55-60%, frequent PVC's, no RWMA, LV severely dilated, LA mildly dilated, mild MV regurgitation Cardiac Monitor 02/2023 > predominantly NSR, 40% PVC's  Arrhythmia / AAD PVC's > flecainide, propranolol  PVC Ablation > case canceled due to lack of PVC's  Risk Assessment/Calculations:     No BP recorded.  {Refresh Note OR Click here to enter BP  :1}***        Physical Exam:   VS:  There were no vitals taken for this visit.   Wt Readings from Last 3 Encounters:  04/25/23 243  lb (110.2 kg)  03/14/23 243 lb 12.8 oz (110.6 kg)  03/08/23 251 lb (113.9 kg)     GEN: Well nourished, well developed in no acute distress NECK: No JVD; No carotid bruits CARDIAC: {EPRHYTHM:28826}, no murmurs, rubs, gallops RESPIRATORY:  Clear to auscultation without rales, wheezing or rhonchi  ABDOMEN: Soft, non-tender, non-distended EXTREMITIES:  No edema; No deformity   ASSESSMENT AND PLAN:    PVC's  Bradycardia Approx 40% burden on recent monitor in 02/2023. Ablation aborted 04/2023 due to inability to induce PVC's. Previously on Toprol XL 25mg , changed to trial alternative BB.  -stop mexiletine 250 mg TID -change to flecainide 100mg  BID  -continue propranolol 10mg  TID  -assess 7 day monitor in 3 weeks to assess flecainide effect on overall PVC burden -most recent BMP reviewed, electrolytes wnl -EKG in one week for flecainide review *** -ETT in one month on flecainide ***    Follow up with Dr. Elberta Fortis {EPFOLLOW XB:[id number]}  Signed, Canary Brim, MSN, APRN, NP-C, AGACNP-BC [facility] - Electrophysiology  05/22/2023, 8:06 PM

## 2023-05-23 ENCOUNTER — Ambulatory Visit: Payer: Medicaid Other | Attending: Pulmonary Disease | Admitting: Pulmonary Disease

## 2023-05-23 DIAGNOSIS — I493 Ventricular premature depolarization: Secondary | ICD-10-CM

## 2023-05-23 DIAGNOSIS — R001 Bradycardia, unspecified: Secondary | ICD-10-CM

## 2023-05-23 MED ORDER — MEXILETINE HCL 250 MG PO CAPS: 250.0000 mg | ORAL_CAPSULE | Freq: Three times a day (TID) | ORAL | 0 refills | Status: DC

## 2023-05-30 DIAGNOSIS — Z79899 Other long term (current) drug therapy: Secondary | ICD-10-CM | POA: Diagnosis not present

## 2023-06-07 DIAGNOSIS — Z79899 Other long term (current) drug therapy: Secondary | ICD-10-CM | POA: Diagnosis not present

## 2023-06-11 ENCOUNTER — Ambulatory Visit: Payer: Medicaid Other | Admitting: Cardiology

## 2023-06-21 NOTE — Telephone Encounter (Signed)
Late Entry: Attempted to reach pt 06/11/23 @ 3:45 pm

## 2023-06-28 DIAGNOSIS — R0902 Hypoxemia: Secondary | ICD-10-CM | POA: Diagnosis not present

## 2023-06-28 DIAGNOSIS — R410 Disorientation, unspecified: Secondary | ICD-10-CM | POA: Diagnosis not present

## 2023-06-28 DIAGNOSIS — R0689 Other abnormalities of breathing: Secondary | ICD-10-CM | POA: Diagnosis not present

## 2023-06-28 DIAGNOSIS — R1084 Generalized abdominal pain: Secondary | ICD-10-CM | POA: Diagnosis not present

## 2023-06-28 DIAGNOSIS — R1030 Lower abdominal pain, unspecified: Secondary | ICD-10-CM | POA: Diagnosis not present

## 2023-07-01 DIAGNOSIS — Z79899 Other long term (current) drug therapy: Secondary | ICD-10-CM | POA: Diagnosis not present

## 2023-07-05 ENCOUNTER — Ambulatory Visit: Payer: Medicaid Other | Attending: Cardiology | Admitting: Cardiology

## 2023-07-05 ENCOUNTER — Encounter: Payer: Self-pay | Admitting: Cardiology

## 2023-07-05 VITALS — BP 120/74 | HR 75 | Ht 70.5 in | Wt 252.2 lb

## 2023-07-05 DIAGNOSIS — I10 Essential (primary) hypertension: Secondary | ICD-10-CM | POA: Diagnosis not present

## 2023-07-05 DIAGNOSIS — I2510 Atherosclerotic heart disease of native coronary artery without angina pectoris: Secondary | ICD-10-CM

## 2023-07-05 DIAGNOSIS — I493 Ventricular premature depolarization: Secondary | ICD-10-CM

## 2023-07-05 MED ORDER — MEXILETINE HCL 250 MG PO CAPS: 250.0000 mg | ORAL_CAPSULE | Freq: Three times a day (TID) | ORAL | 2 refills | Status: DC

## 2023-07-05 MED ORDER — PROPRANOLOL HCL 10 MG PO TABS: 10.0000 mg | ORAL_TABLET | Freq: Three times a day (TID) | ORAL | 2 refills | Status: DC

## 2023-07-05 NOTE — Progress Notes (Signed)
  Electrophysiology Office Note:   Date:  07/05/2023  ID:  George [name], DOB May 31, 1966, MRN [id number]  Primary Cardiologist: Rollene Rotunda, MD Primary Heart Failure: None Electrophysiologist: Jazzmyne Rasnick Jorja Loa, MD      History of Present Illness:   George [name] is a 57 y.o. male with h/o coronary disease, PVCs seen today for routine electrophysiology followup.   Patient was brought in for PVC ablation 04/25/2023.  Had a minimal burden of PVCs at the time of the procedure and thus no ablation was performed.  Since last being seen in our clinic the patient reports doing about the same as he was prior to his ablation.  He continues to have shortness of breath with both exertion and at rest.  He also continues to have significant palpitations.  He feels mexiletine is not quite working as well as it should be.  He would prefer to avoid long-term antiarrhythmics.  he denies chest pain, palpitations, dyspnea, PND, orthopnea, nausea, vomiting, dizziness, syncope, edema, weight gain, or early satiety.   Review of systems complete and found to be negative unless listed in HPI.   EP Information / Studies Reviewed:    EKG is ordered today. Personal review as below.  EKG Interpretation Date/Time:  Friday July 05 2023 14:35:29 EST Ventricular Rate:  75 PR Interval:  166 QRS Duration:  98 QT Interval:  414 QTC Calculation: 462 R Axis:   -16  Text Interpretation: Sinus bradycardia with frequent Premature ventricular complexes Incomplete right bundle branch block When compared with ECG of 14-Mar-2023 14:05, No significant change was found Confirmed by [employer], Pancho Rushing ([id number]) on 07/05/2023 2:43:42 PM     Risk Assessment/Calculations:              Physical Exam:   VS:  BP 120/74 (BP Location: Left Arm, Patient Position: Sitting, Cuff Size: Large)   Pulse 75   Ht 5' 10.5" (1.791 m)   Wt 252 lb 3.2 oz (114.4 kg)   SpO2 97%   BMI 35.68 kg/m    Wt Readings from Last 3 Encounters:   07/05/23 252 lb 3.2 oz (114.4 kg)  04/25/23 243 lb (110.2 kg)  03/14/23 243 lb 12.8 oz (110.6 kg)     GEN: Well nourished, well developed in no acute distress NECK: No JVD; No carotid bruits CARDIAC: Irregularly irregular rate and rhythm, no murmurs, rubs, gallops RESPIRATORY:  Clear to auscultation without rales, wheezing or rhonchi  ABDOMEN: Soft, non-tender, non-distended EXTREMITIES:  No edema; No deformity   ASSESSMENT AND PLAN:    1.  PVCs: Elevated burden at 40%.  Had attempted ablation, though no PVCs occurred.  He is continue to have PVCs.  He would prefer ablation over medication management.  Sotalol and amiodarone are his only other options.  He is currently on mexiletine.  Redell Bhandari plan for ablation.  Risks and benefits have been discussed.  Risk of bleeding, tamponade, heart block, stroke, MI, renal failure, death.  He understands these risks and is agreed to the procedure.  Shawniece Oyola potentially need minimal sedation around the time of procedure.  2.  Hypertension: Currently well-controlled  3.  Coronary disease: No current chest pain.  4.  Hyperlipidemia: Continue Crestor  Follow up with Dr. Elberta Fortis as usual post procedure  Signed, Siearra Amberg Jorja Loa, MD

## 2023-07-05 NOTE — Patient Instructions (Addendum)
Medication Instructions:  Your physician recommends that you continue on your current medications as directed. Please refer to the Current Medication list given to you today.  *If you need a refill on your cardiac medications before your next appointment, please call your pharmacy*   Lab Work: None ordered   Testing/Procedures: Your physician has recommended that you have an ablation. Catheter ablation is a medical procedure used to treat some cardiac arrhythmias (irregular heartbeats). During catheter ablation, a long, thin, flexible tube is put into a blood vessel in your groin (upper thigh), or neck. This tube is called an ablation catheter. It is then guided to your heart through the blood vessel. Radio frequency waves destroy small areas of heart tissue where abnormal heartbeats may cause an arrhythmia to start.  We will call you to schedule this.  It may be several weeks or more before hearing from [place].    Follow-Up: At [facility], you and your health needs are our priority.  As part of our continuing mission to provide you with exceptional heart care, we have created designated Provider Care Teams.  These Care Teams include your primary Cardiologist (physician) and Advanced Practice Providers (APPs -  Physician Assistants and Nurse Practitioners) who all work together to provide you with the care you need, when you need it.  Your next appointment:   to  be determined  The format for your next appointment:   In Person  Provider:   Loman Brooklyn, MD    Thank you for choosing [facility]!!   Dory Horn, RN [phone]

## 2023-07-06 DIAGNOSIS — Z79899 Other long term (current) drug therapy: Secondary | ICD-10-CM | POA: Diagnosis not present

## 2023-07-08 DIAGNOSIS — Z79899 Other long term (current) drug therapy: Secondary | ICD-10-CM | POA: Diagnosis not present

## 2023-07-16 ENCOUNTER — Encounter: Payer: Self-pay | Admitting: *Deleted

## 2023-07-16 ENCOUNTER — Telehealth: Payer: Self-pay | Admitting: *Deleted

## 2023-07-16 DIAGNOSIS — Z01812 Encounter for preprocedural laboratory examination: Secondary | ICD-10-CM

## 2023-07-16 DIAGNOSIS — I493 Ventricular premature depolarization: Secondary | ICD-10-CM

## 2023-07-16 NOTE — Telephone Encounter (Signed)
 Pt informed that Dr. Elberta Fortis said to hold both medications for 4 days prior to ablation procedure.  Aware letter was sent today. Pt verbalizes understanding.

## 2023-07-16 NOTE — Telephone Encounter (Signed)
 Scheduled pt for PVC ablation on 08/30/2023. Pt will stop by [facility] between 2/10 - 2/14 for pre procedure blood work. Pt reports that he held Mexiletine for ablation in October, but he did not hold Propranolol  b/c his instructions did not say to.  Informed pt that his instructions did say to hold both.   Explained that I would forward to MD for confirmation of holding both medications this time and for how long to hold (Dr. Inocencio - hold for 2 days again or more?).  Aware I will send instruction letter by end of week, once MD advises.  Instructed pt to review instruction letter over weekend and call next week with any questions. Patient verbalized understanding and agreeable to plan.

## 2023-08-02 DIAGNOSIS — Z79899 Other long term (current) drug therapy: Secondary | ICD-10-CM | POA: Diagnosis not present

## 2023-08-27 DIAGNOSIS — I493 Ventricular premature depolarization: Secondary | ICD-10-CM | POA: Diagnosis not present

## 2023-08-27 DIAGNOSIS — Z01812 Encounter for preprocedural laboratory examination: Secondary | ICD-10-CM | POA: Diagnosis not present

## 2023-08-28 LAB — BASIC METABOLIC PANEL
BUN/Creatinine Ratio: 7 — ABNORMAL LOW (ref 9–20)
BUN: 8 mg/dL (ref 6–24)
CO2: 23 mmol/L (ref 20–29)
Calcium: 9.4 mg/dL (ref 8.7–10.2)
Chloride: 101 mmol/L (ref 96–106)
Creatinine, Ser: 1.16 mg/dL (ref 0.76–1.27)
Glucose: 107 mg/dL — ABNORMAL HIGH (ref 70–99)
Potassium: 4.1 mmol/L (ref 3.5–5.2)
Sodium: 139 mmol/L (ref 134–144)
eGFR: 73 mL/min/{1.73_m2} (ref >59)

## 2023-08-28 LAB — CBC
Hematocrit: 42.4 % (ref 37.5–51.0)
Hemoglobin: 14.3 g/dL (ref 13.0–17.7)
MCH: 29.4 pg (ref 26.6–33.0)
MCHC: 33.7 g/dL (ref 31.5–35.7)
MCV: 87 fL (ref 79–97)
Platelets: 217 10*3/uL (ref 150–450)
RBC: 4.87 x10E6/uL (ref 4.14–5.80)
RDW: 12.5 % (ref 11.6–15.4)
WBC: 7.5 10*3/uL (ref 3.4–10.8)

## 2023-08-29 NOTE — Pre-Procedure Instructions (Signed)
 Instructed patient on the following items: Arrival time 1000 Nothing to eat or drink after midnight No meds AM of procedure Responsible person to drive you home and stay with you for 24 hrs

## 2023-08-30 ENCOUNTER — Ambulatory Visit (HOSPITAL_COMMUNITY)
Admission: RE | Admit: 2023-08-30 | Discharge: 2023-08-30 | Disposition: A | Discharge status: Home / Self Care | Payer: Medicaid Other | Attending: Cardiology | Admitting: Cardiology

## 2023-08-30 ENCOUNTER — Encounter (HOSPITAL_COMMUNITY)
Admission: RE | Disposition: A | Discharge status: Home / Self Care | Payer: Self-pay | Source: Home / Self Care | Attending: Cardiology

## 2023-08-30 ENCOUNTER — Ambulatory Visit (HOSPITAL_COMMUNITY): Payer: Medicaid Other | Admitting: Anesthesiology

## 2023-08-30 ENCOUNTER — Other Ambulatory Visit: Payer: Self-pay

## 2023-08-30 DIAGNOSIS — F17210 Nicotine dependence, cigarettes, uncomplicated: Secondary | ICD-10-CM

## 2023-08-30 DIAGNOSIS — I2510 Atherosclerotic heart disease of native coronary artery without angina pectoris: Secondary | ICD-10-CM

## 2023-08-30 DIAGNOSIS — Z87891 Personal history of nicotine dependence: Secondary | ICD-10-CM | POA: Diagnosis not present

## 2023-08-30 DIAGNOSIS — I10 Essential (primary) hypertension: Secondary | ICD-10-CM | POA: Diagnosis not present

## 2023-08-30 DIAGNOSIS — Z79899 Other long term (current) drug therapy: Secondary | ICD-10-CM | POA: Diagnosis not present

## 2023-08-30 DIAGNOSIS — I493 Ventricular premature depolarization: Secondary | ICD-10-CM | POA: Insufficient documentation

## 2023-08-30 DIAGNOSIS — I252 Old myocardial infarction: Secondary | ICD-10-CM | POA: Insufficient documentation

## 2023-08-30 HISTORY — PX: PVC ABLATION: EP1236

## 2023-08-30 SURGERY — PVC ABLATION
Anesthesia: General

## 2023-08-30 MED ORDER — FENTANYL CITRATE (PF) 100 MCG/2ML IJ SOLN
INTRAMUSCULAR | Status: AC
  Filled 2023-08-30: qty 2

## 2023-08-30 MED ORDER — ISOPROTERENOL HCL 0.2 MG/ML IJ SOLN
INTRAVENOUS | Status: DC | PRN
  Administered 2023-08-30: 2 ug/min via INTRAVENOUS

## 2023-08-30 MED ORDER — SODIUM CHLORIDE 0.9 % IV SOLN: INTRAVENOUS | Status: DC

## 2023-08-30 MED ORDER — ISOPROTERENOL HCL 0.2 MG/ML IJ SOLN
INTRAMUSCULAR | Status: AC
  Filled 2023-08-30: qty 5

## 2023-08-30 SURGICAL SUPPLY — 1 items: PAD DEFIB RADIO PHYSIO CONN (PAD) ×1 IMPLANT

## 2023-08-30 NOTE — Transfer of Care (Signed)
 Immediate Anesthesia Transfer of Care Note  Patient: George [name]  Procedure(s) Performed: PVC ABLATION  Patient Location: PACU and Cath Lab  Anesthesia Type:MAC combined with regional for post-op pain  Level of Consciousness: awake, alert , and oriented  Airway & Oxygen Therapy: Patient Spontanous Breathing  Post-op Assessment: Report given to RN and Post -op Vital signs reviewed and stable  Post vital signs: Reviewed and stable  Last Vitals:  Vitals Value Taken Time  BP    Temp    Pulse    Resp    SpO2      Last Pain:  Vitals:   08/30/23 1047  TempSrc: Oral         Complications: No notable events documented.

## 2023-08-30 NOTE — H&P (Signed)
  Electrophysiology Office Note:   Date:  08/30/2023  ID:  George [name], DOB Apr 24, 1966, MRN [id number]  Primary Cardiologist: Rollene Rotunda, MD Primary Heart Failure: None Electrophysiologist: Daytona Retana Jorja Loa, MD      History of Present Illness:   George [name] is a 58 y.o. male with h/o coronary disease, PVCs seen today for routine electrophysiology followup.   Patient was brought in for PVC ablation 04/25/2023.  Had a minimal burden of PVCs at the time of the procedure and thus no ablation was performed.  Today, denies symptoms of palpitations, chest pain, shortness of breath, orthopnea, PND, lower extremity edema, claudication, dizziness, presyncope, syncope, bleeding, or neurologic sequela. The patient is tolerating medications without difficulties. Plan repeat PVC ablation today.   EP Information / Studies Reviewed:    EKG is ordered today. Personal review as below.        Risk Assessment/Calculations:             Physical Exam:   VS:  BP (!) 136/92   Pulse (!) 55   Temp 97.9 F (36.6 C) (Oral)   Resp 17   Ht 5\' 10"  (1.778 m)   Wt 115.2 kg   SpO2 98%   BMI 36.45 kg/m    Wt Readings from Last 3 Encounters:  08/30/23 115.2 kg  07/05/23 114.4 kg  04/25/23 110.2 kg    GEN: No acute distress.   Neck: No JVD Cardiac: RRR, no murmurs, rubs, or gallops.  Respiratory: decreased BS bases bilaterally. GI: Soft, nontender, non-distended  MS: No edema; No deformity. Neuro:  Nonfocal  Skin: warm and dry Psych: Normal affect    ASSESSMENT AND PLAN:    1.  PVCs: George [name] has presented today for surgery, with the diagnosis of PVC.  The various methods of treatment have been discussed with the patient and family. After consideration of risks, benefits and other options for treatment, the patient has consented to  Procedure(s): Catheter ablation as a surgical intervention .  Risks include but not limited to complete heart block, stroke, esophageal damage, nerve  damage, bleeding, vascular damage, tamponade, perforation, MI, and death. The patient's history has been reviewed, patient examined, no change in status, stable for surgery.  I have reviewed the patient's chart and labs.  Questions were answered to the patient's satisfaction.    [place] Mullett Elberta Fortis, MD 08/30/2023 11:14 AM

## 2023-08-30 NOTE — Anesthesia Preprocedure Evaluation (Addendum)
 Anesthesia Evaluation  Patient identified by MRN, date of birth, ID band Patient awake    Reviewed: Allergy & Precautions, NPO status , Patient's Chart, lab work & pertinent test results  Airway Mallampati: II  TM Distance: >3 FB Neck ROM: Full    Dental  (+) Missing, Upper Dentures   Pulmonary Current Smoker and Patient abstained from smoking.   Pulmonary exam normal        Cardiovascular hypertension, + CAD, + Past MI and + Cardiac Stents  Normal cardiovascular exam     Neuro/Psych   Anxiety     negative neurological ROS     GI/Hepatic negative GI ROS,,,(+)     substance abuse    Endo/Other  negative endocrine ROS    Renal/GU negative Renal ROS     Musculoskeletal  (+) Arthritis ,  narcotic dependent  Abdominal  (+) + obese  Peds  Hematology negative hematology ROS (+)   Anesthesia Other Findings Multifocal pvc's  Reproductive/Obstetrics                             Anesthesia Physical Anesthesia Plan  ASA: 3  Anesthesia Plan:    Post-op Pain Management:    Induction:   PONV Risk Score and Plan: 0 and Treatment may vary due to age or medical condition  Airway Management Planned: Simple Face Mask  Additional Equipment:   Intra-op Plan:   Post-operative Plan:   Informed Consent: I have reviewed the patients History and Physical, chart, labs and discussed the procedure including the risks, benefits and alternatives for the proposed anesthesia with the patient or authorized representative who has indicated his/her understanding and acceptance.     Dental advisory given  Plan Discussed with: CRNA  Anesthesia Plan Comments:        Anesthesia Quick Evaluation

## 2023-08-31 NOTE — Anesthesia Postprocedure Evaluation (Signed)
 Anesthesia Post Note  Patient: George [name]  Procedure(s) Performed: PVC ABLATION     Patient location during evaluation: Cath Lab Anesthesia Type: MAC Level of consciousness: awake Pain management: pain level controlled Vital Signs Assessment: post-procedure vital signs reviewed and stable Respiratory status: spontaneous breathing, nonlabored ventilation and respiratory function stable Cardiovascular status: blood pressure returned to baseline and stable Postop Assessment: no apparent nausea or vomiting Anesthetic complications: no   There were no known notable events for this encounter.  Last Vitals:  Vitals:   08/30/23 1047 08/30/23 1240  BP: (!) 136/92 136/83  Pulse: (!) 55 (!) 53  Resp: 17   Temp: 36.6 C   SpO2: 98% 99%    Last Pain:  Vitals:   08/30/23 1047  TempSrc: Oral                 Dacy Enrico P Shant Hence

## 2023-09-02 ENCOUNTER — Encounter (HOSPITAL_COMMUNITY): Payer: Self-pay | Admitting: Cardiology

## 2023-09-02 DIAGNOSIS — E7800 Pure hypercholesterolemia, unspecified: Secondary | ICD-10-CM | POA: Diagnosis not present

## 2023-09-02 DIAGNOSIS — Z79899 Other long term (current) drug therapy: Secondary | ICD-10-CM | POA: Diagnosis not present

## 2023-09-06 DIAGNOSIS — Z79899 Other long term (current) drug therapy: Secondary | ICD-10-CM | POA: Diagnosis not present

## 2023-09-19 ENCOUNTER — Telehealth: Payer: Self-pay | Admitting: *Deleted

## 2023-09-19 NOTE — Telephone Encounter (Signed)
 Left message to call back  (Per Dr. Elberta Fortis -- stop Mexiletine, 2 week monitor)

## 2023-09-20 NOTE — Telephone Encounter (Signed)
Pt returning nurses call from yesterday. Please advise 

## 2023-09-23 NOTE — Telephone Encounter (Signed)
 Pt advised to stop Mexiletine per Dr. Elberta Fortis.  Aware he recommends a monitor to determine PVC burden after stopping the medication.   Monitor not ordered yet.  Patient seeing Francis Dowse, [place] this Friday.  Advised to keep this appt and determine if proceeding with monitor vs another direction in tx plan. Pt worried that he may need the Mexiletine if he experiences PVCs, says it typically helps if having them.  Aware he may take if needed b/t now and Friday.  Will address plan of care at that visit. Patient verbalized understanding and agreeable to plan.

## 2023-09-27 ENCOUNTER — Ambulatory Visit: Payer: Medicaid Other | Attending: Physician Assistant | Admitting: Physician Assistant

## 2023-09-27 ENCOUNTER — Encounter: Payer: Self-pay | Admitting: Physician Assistant

## 2023-09-27 ENCOUNTER — Ambulatory Visit (INDEPENDENT_AMBULATORY_CARE_PROVIDER_SITE_OTHER)

## 2023-09-27 VITALS — BP 120/78 | HR 61 | Ht 70.0 in | Wt 254.2 lb

## 2023-09-27 DIAGNOSIS — I493 Ventricular premature depolarization: Secondary | ICD-10-CM | POA: Diagnosis not present

## 2023-09-27 DIAGNOSIS — I2510 Atherosclerotic heart disease of native coronary artery without angina pectoris: Secondary | ICD-10-CM | POA: Diagnosis not present

## 2023-09-27 NOTE — Progress Notes (Signed)
 Cardiology Office Note:  .   Date:  09/27/2023  ID:  George [name], DOB [birth year], MRN [id number] PCP: Jerre Simon, MD  [facility] Providers Cardiologist:  Rollene Rotunda, MD Electrophysiologist:  Regan Lemming, MD {  History of Present Illness: .   George [name] is a 58 y.o. male w/PMHx of  CAD ((2011 NSTEMI with 99% LCX lesion, 2014 found with occluded OM at site of prior stent  HLD Chronic pain PVCs  July 2024 > saw cards team, significant stress/caregiver for his spouse, elderly father. tight finances, found with PVCs, eventually referred to EP for high burdne pVCs on monitoring  Saw Dr. Elberta Fortis, Aug 2024, started on mexiletine, planned for ablation  PVCs: 04/25/2023. Had a minimal burden of PVCs at the time of the procedure and no ablation was performed.  08/30/23: in the EP lab, the patient was not having PVCs. He was started on isoproterenol which was titrated up to 6 mcg/kg/h. He had approximately 1 PVC per minute at this dose as well as with isoproterenol washout.  No ablation was performed   Seems an off chart perhaps conversation occurred > pt advised to stop his mexiletine and wear a 2 week monitor > to revisit baseline PVC burden Pt uncomfortable with plan/coming off mexiletine and avised tokeep his appt  Today's visit is scheduled as post PVC ablation visit  ROS:   He stopped the mexiletine as instructed, last dose was on Monday 3/17  I bit difficult to follow symptoms Seems the PVCs make him feel poorly, but also feels poorly without them Has chronic back pain that I think takes a toll on him as well.  Denies CP, but reports that he never had CP before his MI/interventions (2014) When asked his symptoms back then he thinks was much the same as how he has been feeling for the past year or so.  He reports transitioning onto mexiletine and off mexiletine tends to give him acutely worse symptoms Palpitations, facial numbness (his entire face/b/l),  lethargy, weakness, takes a week or so to get back to "normal" Seem that mexiletine helps his PVCs but thinks it interacts negatively with his Cymbalta causing terrible headaches  He denies SOB No neat syncope or syncope   Arrhythmia/AAD hx PVCs Mexiletine started Aug 2024 with variable success > not full suppression  Studies Reviewed: Marland Kitchen    EKG done today and reviewed by myself:  SR 61bpm, no PVCs   Aug 2024, monitor Predominant rhythm was normal sinus Short beats of non sustained ventricular tach longest run being 4 beats.  Four total runs Runs of SVT with the longest running being 19 beats.   Frequent PVCs with bigeminy.   Isolated PVCs were very frequent constituting 40% of the total beats.   02/21/23: TTE  1. HR 40s to 60s during study Frequent PVCs. Left ventricular ejection  fraction, by estimation, is 55 to 60%. The left ventricle has normal  function. The left ventricle has no regional wall motion abnormalities.  The left ventricular internal cavity size   was severely dilated. Left ventricular diastolic parameters were normal.   2. Right ventricular systolic function is normal. The right ventricular  size is normal.   3. Left atrial size was mildly dilated.   4. The mitral valve is normal in structure. Mild mitral valve  regurgitation.   5. The aortic valve is tricuspid. Aortic valve regurgitation is not  visualized.   6. The inferior vena cava is normal in  size with <50% respiratory  variability, suggesting right atrial pressure of 8 mmHg.    01/03/21: stress myoview Nuclear stress EF: 46%. visually, the EF appears to be greater than the 46% calculation from the computer program This is a low risk study. There is no evidence of ischemia and no evidence of previous infarction. The study is normal.  Risk Assessment/Calculations:    Physical Exam:   VS:  There were no vitals taken for this visit.   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    PVCs None today on his EKG or with a very prolonged auscultation Says each visit for ablation they quit the night before his procedure Was having quite alot though last night and even this morning by symptoms  Unclear what makes him feel worse The PVCs or the medicine (?) Placed a XT monitor today to get an idea of his burden off drug He mentions that in the past has had to self restart his mexiletine when the PVCs start to become unbearable  Advised to let us know if so/mark his monitor   CAD No obvious ischemic/anginal symptoms, known CAD Remote interventions No acute changes on his EKG Neg stress test 2022 Over due to see his cards team C/w Dr. Ernestina Columbia >> asked to get him in to see them    Dispo: back with EP in 2-3 weks, sooner if needed  Signed, Sheilah Pigeon, PA-C

## 2023-09-27 NOTE — Progress Notes (Unsigned)
Applied a 7 day Zio XT monitor to patient in the office  Camnitz to read

## 2023-09-27 NOTE — Patient Instructions (Addendum)
 Medication Instructions:   Your physician recommends that you continue on your current medications as directed. Please refer to the Current Medication list given to you today.   *If you need a refill on your cardiac medications before your next appointment, please call your pharmacy*   Lab Work: NONE ORDERED  TODAY    If you have labs (blood work) drawn today and your tests are completely normal, you will receive your results only by: MyChart Message (if you have MyChart) OR A paper copy in the mail If you have any lab test that is abnormal or we need to change your treatment, we will call you to review the results.   Testing/Procedures:  Your physician has recommended that you wear an event monitor. Event monitors are medical devices that record the heart's electrical activity. Doctors most often [place] these monitors to diagnose arrhythmias. Arrhythmias are problems with the speed or rhythm of the heartbeat. The monitor is a small, portable device. You can wear one while you do your normal daily activities. This is usually used to diagnose what is causing palpitations/syncope (passing out).      Follow-Up: At [facility], you and your health needs are our priority.  As part of our continuing mission to provide you with exceptional heart care, we have created designated Provider Care Teams.  These Care Teams include your primary Cardiologist (physician) and Advanced Practice Providers (APPs -  Physician Assistants and Nurse Practitioners) who all work together to provide you with the care you need, when you need it.  We recommend signing up for the patient portal called "MyChart".  Sign up information is provided on this After Visit Summary.  MyChart is used to connect with patients for Virtual Visits (Telemedicine).  Patients are able to view lab/test results, encounter notes, upcoming appointments, etc.  Non-urgent messages can be sent to your provider as well.   To learn more  about what you can do with MyChart, go to [email or web address].     Your next appointment:  ASAP  WITH  DR HOCHREIN /APP   DR CAMNITZ/ APP IN 3 TO 4 WEEKS ( CONTACT  CASSIE HALL/ ANGELINE HAMMER FOR EP SCHEDULING ISSUES )    Other Instructions     1st Floor: - Lobby - Registration  - Pharmacy  - Lab - Cafe  2nd Floor: - PV Lab - Diagnostic Testing (echo, CT, nuclear med)  3rd Floor: - Vacant  4th Floor: - TCTS (cardiothoracic surgery) - AFib Clinic - Structural Heart Clinic - Vascular Surgery  - Vascular Ultrasound  5th Floor: - HeartCare Cardiology (general and EP) - Clinical Pharmacy for coumadin, hypertension, lipid, weight-loss medications, and med management appointments    Valet parking services will be available as well.

## 2023-09-30 DIAGNOSIS — Z79899 Other long term (current) drug therapy: Secondary | ICD-10-CM | POA: Diagnosis not present

## 2023-10-07 ENCOUNTER — Other Ambulatory Visit (HOSPITAL_COMMUNITY): Payer: Self-pay | Admitting: Cardiology

## 2023-10-07 ENCOUNTER — Ambulatory Visit (HOSPITAL_COMMUNITY): Admission: RE | Admit: 2023-10-07 | Source: Ambulatory Visit

## 2023-10-07 DIAGNOSIS — I714 Abdominal aortic aneurysm, without rupture, unspecified: Secondary | ICD-10-CM

## 2023-10-08 ENCOUNTER — Encounter: Payer: Self-pay | Admitting: Cardiology

## 2023-10-16 DIAGNOSIS — I493 Ventricular premature depolarization: Secondary | ICD-10-CM | POA: Diagnosis not present

## 2023-10-21 NOTE — Progress Notes (Unsigned)
  Electrophysiology Office Note:   Date:  10/21/2023  ID:  George [name], DOB 1966/02/23, MRN [id number]  Primary Cardiologist: Eilleen Grates, MD Primary Heart Failure: None Electrophysiologist: Lacharles Altschuler Cortland Ding, MD  {Click to update primary MD,subspecialty MD or APP then REFRESH:1}    History of Present Illness:   George [name] is a 58 y.o. male with h/o PVCs seen today for routine electrophysiology followup.   Since last being seen in our clinic the patient reports doing ***.  he denies chest pain, palpitations, dyspnea, PND, orthopnea, nausea, vomiting, dizziness, syncope, edema, weight gain, or early satiety.   Review of systems complete and found to be negative unless listed in HPI.   EP Information / Studies Reviewed:    {EKGtoday:28818}        Risk Assessment/Calculations:             Physical Exam:   VS:  There were no vitals taken for this visit.   Wt Readings from Last 3 Encounters:  09/27/23 254 lb 3.2 oz (115.3 kg)  08/30/23 254 lb (115.2 kg)  07/05/23 252 lb 3.2 oz (114.4 kg)     GEN: Well nourished, well developed in no acute distress NECK: No JVD; No carotid bruits CARDIAC: {EPRHYTHM:28826}, no murmurs, rubs, gallops RESPIRATORY:  Clear to auscultation without rales, wheezing or rhonchi  ABDOMEN: Soft, non-tender, non-distended EXTREMITIES:  No edema; No deformity   ASSESSMENT AND PLAN:    1.  PVCs: Burden of 40% on prior monitor.  He has been brought in for ablation twice without inducible PVCs.  Repeat monitor with a 5.9% burden.  2.  Hypertension:***  3.  Coronary artery disease: No current chest pain  4.  Hyperlipidemia: Continue Crestor  Follow up with {NFAOZ:30865} {EPFOLLOW HQ:46962}  Signed, Siyah Mault Cortland Ding, MD

## 2023-10-22 ENCOUNTER — Ambulatory Visit: Attending: Cardiology | Admitting: Cardiology

## 2023-10-22 ENCOUNTER — Encounter: Payer: Self-pay | Admitting: Cardiology

## 2023-10-22 VITALS — BP 126/80 | HR 60 | Ht 70.0 in | Wt 249.0 lb

## 2023-10-22 DIAGNOSIS — I493 Ventricular premature depolarization: Secondary | ICD-10-CM

## 2023-10-22 DIAGNOSIS — I10 Essential (primary) hypertension: Secondary | ICD-10-CM | POA: Diagnosis not present

## 2023-10-22 DIAGNOSIS — I2510 Atherosclerotic heart disease of native coronary artery without angina pectoris: Secondary | ICD-10-CM | POA: Diagnosis not present

## 2023-10-22 NOTE — Addendum Note (Signed)
 Addended by: Arva Lathe on: 10/22/2023 08:57 AM   Modules accepted: Orders

## 2023-10-22 NOTE — Patient Instructions (Signed)
 Medication Instructions:  Your physician has recommended you make the following change in your medication:   ** Stop Mexiletine   *If you need a refill on your cardiac medications before your next appointment, please call your pharmacy*  Lab Work: None ordered.  If you have labs (blood work) drawn today and your tests are completely normal, you will receive your results only by: MyChart Message (if you have MyChart) OR A paper copy in the mail If you have any lab test that is abnormal or we need to change your treatment, we will call you to review the results.  Testing/Procedures: None ordered.   Follow-Up: At [facility], you and your health needs are our priority.  As part of our continuing mission to provide you with exceptional heart care, our providers are all part of one team.  This team includes your primary Cardiologist (physician) and Advanced Practice Providers or APPs (Physician Assistants and Nurse Practitioners) who all work together to provide you with the care you need, when you need it.  Your next appointment:   Follow up with Dr Lawana Pray as needed      1st Floor: - Lobby - Registration  - Pharmacy  - Lab - Cafe  2nd Floor: - PV Lab - Diagnostic Testing (echo, CT, nuclear med)  3rd Floor: - Vacant  4th Floor: - TCTS (cardiothoracic surgery) - AFib Clinic - Structural Heart Clinic - Vascular Surgery  - Vascular Ultrasound  5th Floor: - HeartCare Cardiology (general and EP) - Clinical Pharmacy for coumadin, hypertension, lipid, weight-loss medications, and med management appointments    Valet parking services will be available as well.

## 2023-10-30 DIAGNOSIS — Z79899 Other long term (current) drug therapy: Secondary | ICD-10-CM | POA: Diagnosis not present

## 2023-11-06 ENCOUNTER — Encounter (HOSPITAL_COMMUNITY): Payer: Self-pay

## 2023-11-26 DIAGNOSIS — Z79899 Other long term (current) drug therapy: Secondary | ICD-10-CM | POA: Diagnosis not present

## 2023-11-28 ENCOUNTER — Ambulatory Visit: Admitting: Cardiology

## 2023-12-15 NOTE — Progress Notes (Unsigned)
 Cardiology Office Note:   Date:  12/16/2023  ID:  George [name], DOB 24-Oct-1965, MRN [id number] PCP: Marigene Shoulder, PA-C  [facility] Providers Cardiologist:  Eilleen Grates, MD Electrophysiologist:  Will Cortland Ding, MD {  History of Present Illness:   George [name] is a 58 y.o. male who presents for follow-up of coronary artery disease.   He was previously seen by Dr. Mitzie Anda last in 2014.   In 07/07/10 he had chest pain. He ruled in for NSTEMI. Cath demonstrated a 99% CFX lesion that was treated with a DES. Cath also demonstrated pLAD 60% stenosis.  In 2014 he had recurrent chest pain and had an occluded OM at the site of the prior stent.  EF was normal.  He did not tolerate beta blocker secondary to bradycardia.  Lexiscan  Myoview  was negative in 2022 for any evidence of ischemia.  He has seen Dr. Lawana Pray.  He has 40% PVCs but has not had inducible PVCs.  He was treated with mexiletine.  He had attempted EP studies.  He been planned for ablation but did not have inducible PVCs mapped.   He says he still very bothered by these.  He is tolerating beta-blockers but does not tolerate med titration.  He said he occasionally gets these at night where they wake him up.  He is very fatigued because of them.  He has not had any presyncope or syncope.  He does some yard work but he is limited by back pain.  He is able to a weed trimmer but he gets fatigued.  He is not having any new chest pressure, neck or arm discomfort.  He had no new symptoms since his stress test above.   ROS: As stated in the HPI and negative for all other systems.  Studies Reviewed:    EKG:   NA  Risk Assessment/Calculations:              Physical Exam:   VS:  BP 116/76 (BP Location: Left Arm, Patient Position: Sitting, Cuff Size: Normal)   Pulse (!) 45   Ht 5' 10.5" (1.791 m)   Wt 257 lb 3.2 oz (116.7 kg)   SpO2 96%   BMI 36.38 kg/m    Wt Readings from Last 3 Encounters:  12/16/23 257 lb 3.2 oz  (116.7 kg)  10/22/23 249 lb (112.9 kg)  09/27/23 254 lb 3.2 oz (115.3 kg)     GEN: Well nourished, well developed in no acute distress NECK: No JVD; No carotid bruits CARDIAC: RRR, no murmurs, rubs, gallops RESPIRATORY:  Clear to auscultation without rales, wheezing or rhonchi  ABDOMEN: Soft, non-tender, non-distended EXTREMITIES:  No edema; No deformity   ASSESSMENT AND PLAN:   CAD:   He has had no new symptoms since 2022 when he had a stress test.  He will continue with risk reduction.   PVCs: He follows with Dr. Lawana Pray.  He will remain on the beta-blocker and we did discuss an occasional as needed propranolol  if he is having significant symptoms.   BRADYCARDIA:   He has not tolerated daily med titration with his beta-blocker so we will remain on the meds as listed.  He can use as needed as needed.    DYSLIPIDEMIA: His LDL was 102 when I last saw it but he is overdue for follow-up.  He will get a lipid profile with his primary care and let me know what this result is.  I would like his LDL to be  in the 50s.   TOBACCO ABUSE:   He says it has been 2 weeks since he smokes cigarettes or vape.    AAA:  This was he had this checked today.  He has 4.6 x 4.5 and will have a repeat ultrasound in 1 year.      Follow up with me in 1 year  Signed, Eilleen Grates, MD

## 2023-12-16 ENCOUNTER — Ambulatory Visit (INDEPENDENT_AMBULATORY_CARE_PROVIDER_SITE_OTHER): Admitting: Cardiology

## 2023-12-16 ENCOUNTER — Encounter: Payer: Self-pay | Admitting: Cardiology

## 2023-12-16 ENCOUNTER — Ambulatory Visit
Admission: RE | Admit: 2023-12-16 | Discharge: 2023-12-16 | Disposition: A | Discharge status: Home / Self Care | Source: Ambulatory Visit | Attending: Cardiology | Admitting: Cardiology

## 2023-12-16 VITALS — BP 116/76 | HR 45 | Ht 70.5 in | Wt 257.2 lb

## 2023-12-16 DIAGNOSIS — R001 Bradycardia, unspecified: Secondary | ICD-10-CM

## 2023-12-16 DIAGNOSIS — I714 Abdominal aortic aneurysm, without rupture, unspecified: Secondary | ICD-10-CM | POA: Diagnosis not present

## 2023-12-16 DIAGNOSIS — I2510 Atherosclerotic heart disease of native coronary artery without angina pectoris: Secondary | ICD-10-CM

## 2023-12-16 DIAGNOSIS — I7143 Infrarenal abdominal aortic aneurysm, without rupture: Secondary | ICD-10-CM | POA: Diagnosis not present

## 2023-12-16 DIAGNOSIS — I493 Ventricular premature depolarization: Secondary | ICD-10-CM | POA: Insufficient documentation

## 2023-12-16 NOTE — Patient Instructions (Signed)

## 2023-12-18 ENCOUNTER — Ambulatory Visit: Payer: Self-pay | Admitting: Cardiology

## 2023-12-24 DIAGNOSIS — Z79899 Other long term (current) drug therapy: Secondary | ICD-10-CM | POA: Diagnosis not present

## 2023-12-24 DIAGNOSIS — Z0000 Encounter for general adult medical examination without abnormal findings: Secondary | ICD-10-CM | POA: Diagnosis not present

## 2023-12-24 DIAGNOSIS — Z125 Encounter for screening for malignant neoplasm of prostate: Secondary | ICD-10-CM | POA: Diagnosis not present

## 2023-12-24 DIAGNOSIS — E559 Vitamin D deficiency, unspecified: Secondary | ICD-10-CM | POA: Diagnosis not present

## 2024-01-22 ENCOUNTER — Encounter: Payer: Self-pay | Admitting: Cardiology

## 2024-01-22 DIAGNOSIS — M129 Arthropathy, unspecified: Secondary | ICD-10-CM | POA: Diagnosis not present

## 2024-01-22 DIAGNOSIS — Z79899 Other long term (current) drug therapy: Secondary | ICD-10-CM | POA: Diagnosis not present

## 2024-01-22 NOTE — Telephone Encounter (Signed)
 Spoke to pt earlier, scheduled to see Camnitz end of month, per pt request. Pt is restarting his Mexiletine, approved by [employer]. Advised to call office if issues occur after restarting medication.

## 2024-01-28 DIAGNOSIS — Z79899 Other long term (current) drug therapy: Secondary | ICD-10-CM | POA: Diagnosis not present

## 2024-01-31 ENCOUNTER — Encounter (HOSPITAL_COMMUNITY): Payer: Self-pay

## 2024-01-31 ENCOUNTER — Other Ambulatory Visit: Payer: Self-pay

## 2024-01-31 ENCOUNTER — Emergency Department (HOSPITAL_COMMUNITY)
Admission: EM | Admit: 2024-01-31 | Discharge: 2024-01-31 | Disposition: A | Discharge status: Home / Self Care | Attending: Emergency Medicine | Admitting: Emergency Medicine

## 2024-01-31 ENCOUNTER — Emergency Department (HOSPITAL_COMMUNITY)

## 2024-01-31 DIAGNOSIS — K8689 Other specified diseases of pancreas: Secondary | ICD-10-CM | POA: Diagnosis not present

## 2024-01-31 DIAGNOSIS — R002 Palpitations: Secondary | ICD-10-CM | POA: Diagnosis present

## 2024-01-31 DIAGNOSIS — Z7982 Long term (current) use of aspirin: Secondary | ICD-10-CM | POA: Diagnosis not present

## 2024-01-31 DIAGNOSIS — F1123 Opioid dependence with withdrawal: Secondary | ICD-10-CM | POA: Insufficient documentation

## 2024-01-31 DIAGNOSIS — R079 Chest pain, unspecified: Secondary | ICD-10-CM | POA: Diagnosis not present

## 2024-01-31 DIAGNOSIS — R0602 Shortness of breath: Secondary | ICD-10-CM | POA: Diagnosis not present

## 2024-01-31 DIAGNOSIS — F1193 Opioid use, unspecified with withdrawal: Secondary | ICD-10-CM

## 2024-01-31 DIAGNOSIS — I700 Atherosclerosis of aorta: Secondary | ICD-10-CM | POA: Diagnosis not present

## 2024-01-31 DIAGNOSIS — I2510 Atherosclerotic heart disease of native coronary artery without angina pectoris: Secondary | ICD-10-CM | POA: Insufficient documentation

## 2024-01-31 DIAGNOSIS — R188 Other ascites: Secondary | ICD-10-CM | POA: Diagnosis not present

## 2024-01-31 DIAGNOSIS — F17210 Nicotine dependence, cigarettes, uncomplicated: Secondary | ICD-10-CM | POA: Diagnosis not present

## 2024-01-31 DIAGNOSIS — Z79891 Long term (current) use of opiate analgesic: Secondary | ICD-10-CM

## 2024-01-31 DIAGNOSIS — I771 Stricture of artery: Secondary | ICD-10-CM | POA: Diagnosis not present

## 2024-01-31 LAB — COMPREHENSIVE METABOLIC PANEL WITH GFR
ALT: 36 U/L (ref 0–44)
AST: 35 U/L (ref 15–41)
Albumin: 4.5 g/dL (ref 3.5–5.0)
Alkaline Phosphatase: 74 U/L (ref 38–126)
Anion gap: 12 (ref 5–15)
BUN: 15 mg/dL (ref 6–20)
CO2: 23 mmol/L (ref 22–32)
Calcium: 9.8 mg/dL (ref 8.9–10.3)
Chloride: 99 mmol/L (ref 98–111)
Creatinine, Ser: 1.27 mg/dL — ABNORMAL HIGH (ref 0.61–1.24)
GFR, Estimated: >60 mL/min (ref >60)
Glucose, Bld: 113 mg/dL — ABNORMAL HIGH (ref 70–99)
Potassium: 4 mmol/L (ref 3.5–5.1)
Sodium: 134 mmol/L — ABNORMAL LOW (ref 135–145)
Total Bilirubin: 0.9 mg/dL (ref 0.0–1.2)
Total Protein: 7.7 g/dL (ref 6.5–8.1)

## 2024-01-31 LAB — TROPONIN I (HIGH SENSITIVITY)
Troponin I (High Sensitivity): 6 ng/L (ref <18)
Troponin I (High Sensitivity): 5 ng/L (ref <18)

## 2024-01-31 LAB — CBC WITH DIFFERENTIAL/PLATELET
Abs Immature Granulocytes: 0.03 K/uL (ref 0.00–0.07)
Basophils Absolute: 0.1 K/uL (ref 0.0–0.1)
Basophils Relative: 1 %
Eosinophils Absolute: 0.1 K/uL (ref 0.0–0.5)
Eosinophils Relative: 1 %
HCT: 46 % (ref 39.0–52.0)
Hemoglobin: 15.5 g/dL (ref 13.0–17.0)
Immature Granulocytes: 0 %
Lymphocytes Relative: 17 %
Lymphs Abs: 1.3 K/uL (ref 0.7–4.0)
MCH: 29.6 pg (ref 26.0–34.0)
MCHC: 33.7 g/dL (ref 30.0–36.0)
MCV: 88 fL (ref 80.0–100.0)
Monocytes Absolute: 0.6 K/uL (ref 0.1–1.0)
Monocytes Relative: 8 %
Neutro Abs: 5.9 K/uL (ref 1.7–7.7)
Neutrophils Relative %: 73 %
Platelets: 240 K/uL (ref 150–400)
RBC: 5.23 MIL/uL (ref 4.22–5.81)
RDW: 12.3 % (ref 11.5–15.5)
WBC: 8 K/uL (ref 4.0–10.5)
nRBC: 0 % (ref 0.0–0.2)

## 2024-01-31 LAB — LIPASE, BLOOD: Lipase: 32 U/L (ref 11–51)

## 2024-01-31 LAB — BRAIN NATRIURETIC PEPTIDE: B Natriuretic Peptide: 35 pg/mL (ref 0.0–100.0)

## 2024-01-31 MED ORDER — MORPHINE SULFATE (PF) 4 MG/ML IV SOLN
4.0000 mg | Freq: Once | INTRAVENOUS | Status: AC
  Administered 2024-01-31: 4 mg via INTRAVENOUS
  Filled 2024-01-31: qty 1

## 2024-01-31 MED ORDER — ONDANSETRON HCL 4 MG/2ML IJ SOLN
4.0000 mg | Freq: Once | INTRAMUSCULAR | Status: AC
  Administered 2024-01-31: 4 mg via INTRAVENOUS
  Filled 2024-01-31: qty 2

## 2024-01-31 MED ORDER — ONDANSETRON HCL 4 MG PO TABS: 4.0000 mg | ORAL_TABLET | ORAL | 0 refills | Status: AC | PRN

## 2024-01-31 MED ORDER — IOHEXOL 350 MG/ML SOLN
100.0000 mL | Freq: Once | INTRAVENOUS | Status: AC | PRN
  Administered 2024-01-31: 100 mL via INTRAVENOUS

## 2024-01-31 MED ORDER — SODIUM CHLORIDE 0.9 % IV BOLUS
1000.0000 mL | Freq: Once | INTRAVENOUS | Status: AC
  Administered 2024-01-31: 1000 mL via INTRAVENOUS

## 2024-01-31 NOTE — ED Notes (Signed)
 ED Provider at bedside.
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
## 2024-01-31 NOTE — Discharge Instructions (Addendum)
 It was a pleasure caring for you today in the emergency department.  Please follow up with Dr Inocencio next week at your scheduled appointment   Please return to the emergency department for any worsening or worrisome symptoms.

## 2024-01-31 NOTE — ED Triage Notes (Addendum)
 Patient come in POV from home for complaint of having problems with PVC's and has had heart ablations. Patient stated having palpitations denise's pain but feels like a flounder has been put in his chest, and having shortness of breath. HX of two heart attacks.

## 2024-02-06 ENCOUNTER — Ambulatory Visit: Attending: Cardiology | Admitting: Cardiology

## 2024-02-06 ENCOUNTER — Encounter: Payer: Self-pay | Admitting: Cardiology

## 2024-02-06 VITALS — BP 104/78 | HR 54 | Ht 70.0 in | Wt 257.0 lb

## 2024-02-06 DIAGNOSIS — I493 Ventricular premature depolarization: Secondary | ICD-10-CM | POA: Diagnosis not present

## 2024-02-06 DIAGNOSIS — I2510 Atherosclerotic heart disease of native coronary artery without angina pectoris: Secondary | ICD-10-CM | POA: Insufficient documentation

## 2024-02-06 DIAGNOSIS — I10 Essential (primary) hypertension: Secondary | ICD-10-CM | POA: Insufficient documentation

## 2024-02-06 DIAGNOSIS — E785 Hyperlipidemia, unspecified: Secondary | ICD-10-CM | POA: Diagnosis not present

## 2024-02-06 NOTE — Progress Notes (Signed)
  Electrophysiology Office Note:   Date:  02/06/2024  ID:  ONEIL BEHNEY, DOB [birth year], MRN [id number]  Primary Cardiologist: Lynwood Schilling, MD Primary Heart Failure: None Electrophysiologist: Narissa Beaufort Gladis Norton, MD      History of Present Illness:   George [name] is a 58 y.o. male with h/o PVCs, hypertension, coronary artery disease, hyperlipidemia seen today for routine electrophysiology followup.   Since last being seen in our clinic the patient reports doing significantly better.  He was previously having a high burden of PVCs.  He has restarted his mexiletine.  Since restarting mexiletine, he has noted a significantly reduced PVC burden.  He continues to have back pain.  He was recently in the emergency room as his pain medications were abruptly stopped.  He had opioid withdrawal.  He is not taking his medications and is much more comfortable..  he denies chest pain, palpitations, dyspnea, PND, orthopnea, nausea, vomiting, dizziness, syncope, edema, weight gain, or early satiety.   Review of systems complete and found to be negative unless listed in HPI.   EP Information / Studies Reviewed:    EKG is not ordered today. EKG from 01/31/2024 reviewed which showed sinus rhythm        Risk Assessment/Calculations:            Physical Exam:   VS:  BP 104/78 (BP Location: Right Arm, Patient Position: Sitting, Cuff Size: Large)   Pulse (!) 54   Ht 5' 10 (1.778 m)   Wt 257 lb (116.6 kg)   SpO2 96%   BMI 36.88 kg/m    Wt Readings from Last 3 Encounters:  02/06/24 257 lb (116.6 kg)  01/31/24 255 lb (115.7 kg)  12/16/23 257 lb 3.2 oz (116.7 kg)     GEN: Well nourished, well developed in no acute distress NECK: No JVD; No carotid bruits CARDIAC: Regular rate and rhythm, no murmurs, rubs, gallops RESPIRATORY:  Clear to auscultation without rales, wheezing or rhonchi  ABDOMEN: Soft, non-tender, non-distended EXTREMITIES:  No edema; No deformity   ASSESSMENT AND PLAN:    1.   PVCs: 40% burden found on cardiac monitor.  Repeat monitor with a 5.9% burden.  He has had an attempt at ablation x 2, but each time has not had PVCs.  He is on mexiletine.  He is not having PVCs on exam today.  He is feeling much improved with better heart rate response.  Mileidy Atkin continue with current management.  2.  Hypertension: Well-controlled  3.  Coronary artery disease: No current chest pain.  Plan per primary cardiology  4.  Hyperlipidemia: Continue Crestor  per primary cardiology.  Follow up with EP APP in 6 months  Signed, Adanya Sosinski Gladis Norton, MD

## 2024-02-10 ENCOUNTER — Other Ambulatory Visit: Payer: Self-pay | Admitting: Cardiology

## 2024-02-10 DIAGNOSIS — R972 Elevated prostate specific antigen [PSA]: Secondary | ICD-10-CM | POA: Diagnosis not present

## 2024-02-10 DIAGNOSIS — Z79899 Other long term (current) drug therapy: Secondary | ICD-10-CM | POA: Diagnosis not present

## 2024-02-10 DIAGNOSIS — N401 Enlarged prostate with lower urinary tract symptoms: Secondary | ICD-10-CM | POA: Diagnosis not present

## 2024-02-10 DIAGNOSIS — E559 Vitamin D deficiency, unspecified: Secondary | ICD-10-CM | POA: Diagnosis not present

## 2024-02-10 DIAGNOSIS — R351 Nocturia: Secondary | ICD-10-CM | POA: Diagnosis not present

## 2024-02-10 DIAGNOSIS — I714 Abdominal aortic aneurysm, without rupture, unspecified: Secondary | ICD-10-CM | POA: Diagnosis not present

## 2024-02-10 DIAGNOSIS — M51362 Other intervertebral disc degeneration, lumbar region with discogenic back pain and lower extremity pain: Secondary | ICD-10-CM | POA: Diagnosis not present

## 2024-02-10 DIAGNOSIS — Z6836 Body mass index (BMI) 36.0-36.9, adult: Secondary | ICD-10-CM | POA: Diagnosis not present

## 2024-02-10 DIAGNOSIS — E7800 Pure hypercholesterolemia, unspecified: Secondary | ICD-10-CM | POA: Diagnosis not present

## 2024-02-10 DIAGNOSIS — G43909 Migraine, unspecified, not intractable, without status migrainosus: Secondary | ICD-10-CM | POA: Diagnosis not present

## 2024-02-10 DIAGNOSIS — I498 Other specified cardiac arrhythmias: Secondary | ICD-10-CM | POA: Diagnosis not present

## 2024-02-10 DIAGNOSIS — I10 Essential (primary) hypertension: Secondary | ICD-10-CM | POA: Diagnosis not present

## 2024-02-11 ENCOUNTER — Encounter: Payer: Self-pay | Admitting: Cardiology

## 2024-02-12 MED ORDER — MEXILETINE HCL 250 MG PO CAPS
250.0000 mg | ORAL_CAPSULE | Freq: Three times a day (TID) | ORAL | 3 refills | Status: AC
Start: 2024-02-12 — End: ?

## 2024-02-12 NOTE — Telephone Encounter (Signed)
 Refilled 02/12/24 at 3:56 pm

## 2024-03-11 DIAGNOSIS — Z79899 Other long term (current) drug therapy: Secondary | ICD-10-CM | POA: Diagnosis not present

## 2024-03-11 DIAGNOSIS — E7800 Pure hypercholesterolemia, unspecified: Secondary | ICD-10-CM | POA: Diagnosis not present

## 2024-03-11 DIAGNOSIS — I10 Essential (primary) hypertension: Secondary | ICD-10-CM | POA: Diagnosis not present

## 2024-03-11 DIAGNOSIS — Z1211 Encounter for screening for malignant neoplasm of colon: Secondary | ICD-10-CM | POA: Diagnosis not present

## 2024-03-11 DIAGNOSIS — Z6836 Body mass index (BMI) 36.0-36.9, adult: Secondary | ICD-10-CM | POA: Diagnosis not present

## 2024-03-11 DIAGNOSIS — N1831 Chronic kidney disease, stage 3a: Secondary | ICD-10-CM | POA: Diagnosis not present

## 2024-03-11 DIAGNOSIS — I714 Abdominal aortic aneurysm, without rupture, unspecified: Secondary | ICD-10-CM | POA: Diagnosis not present

## 2024-03-11 DIAGNOSIS — M51362 Other intervertebral disc degeneration, lumbar region with discogenic back pain and lower extremity pain: Secondary | ICD-10-CM | POA: Diagnosis not present

## 2024-04-10 DIAGNOSIS — G43909 Migraine, unspecified, not intractable, without status migrainosus: Secondary | ICD-10-CM | POA: Diagnosis not present

## 2024-04-10 DIAGNOSIS — R7303 Prediabetes: Secondary | ICD-10-CM | POA: Diagnosis not present

## 2024-04-10 DIAGNOSIS — R972 Elevated prostate specific antigen [PSA]: Secondary | ICD-10-CM | POA: Diagnosis not present

## 2024-04-10 DIAGNOSIS — Z6836 Body mass index (BMI) 36.0-36.9, adult: Secondary | ICD-10-CM | POA: Diagnosis not present

## 2024-04-10 DIAGNOSIS — E559 Vitamin D deficiency, unspecified: Secondary | ICD-10-CM | POA: Diagnosis not present

## 2024-04-10 DIAGNOSIS — M51362 Other intervertebral disc degeneration, lumbar region with discogenic back pain and lower extremity pain: Secondary | ICD-10-CM | POA: Diagnosis not present

## 2024-04-10 DIAGNOSIS — N1831 Chronic kidney disease, stage 3a: Secondary | ICD-10-CM | POA: Diagnosis not present

## 2024-04-10 DIAGNOSIS — I498 Other specified cardiac arrhythmias: Secondary | ICD-10-CM | POA: Diagnosis not present

## 2024-04-10 DIAGNOSIS — I714 Abdominal aortic aneurysm, without rupture, unspecified: Secondary | ICD-10-CM | POA: Diagnosis not present

## 2024-04-10 DIAGNOSIS — I10 Essential (primary) hypertension: Secondary | ICD-10-CM | POA: Diagnosis not present

## 2024-04-10 DIAGNOSIS — Z79899 Other long term (current) drug therapy: Secondary | ICD-10-CM | POA: Diagnosis not present

## 2024-04-10 DIAGNOSIS — E7800 Pure hypercholesterolemia, unspecified: Secondary | ICD-10-CM | POA: Diagnosis not present

## 2024-04-17 DIAGNOSIS — Z79899 Other long term (current) drug therapy: Secondary | ICD-10-CM | POA: Diagnosis not present

## 2024-05-08 DIAGNOSIS — M51362 Other intervertebral disc degeneration, lumbar region with discogenic back pain and lower extremity pain: Secondary | ICD-10-CM | POA: Diagnosis not present

## 2024-05-08 DIAGNOSIS — I10 Essential (primary) hypertension: Secondary | ICD-10-CM | POA: Diagnosis not present

## 2024-05-08 DIAGNOSIS — N1831 Chronic kidney disease, stage 3a: Secondary | ICD-10-CM | POA: Diagnosis not present

## 2024-05-08 DIAGNOSIS — R7303 Prediabetes: Secondary | ICD-10-CM | POA: Diagnosis not present

## 2024-05-08 DIAGNOSIS — Z79899 Other long term (current) drug therapy: Secondary | ICD-10-CM | POA: Diagnosis not present

## 2024-05-08 DIAGNOSIS — I498 Other specified cardiac arrhythmias: Secondary | ICD-10-CM | POA: Diagnosis not present

## 2024-05-08 DIAGNOSIS — E7800 Pure hypercholesterolemia, unspecified: Secondary | ICD-10-CM | POA: Diagnosis not present

## 2024-05-08 DIAGNOSIS — R972 Elevated prostate specific antigen [PSA]: Secondary | ICD-10-CM | POA: Diagnosis not present

## 2024-05-08 DIAGNOSIS — Z6836 Body mass index (BMI) 36.0-36.9, adult: Secondary | ICD-10-CM | POA: Diagnosis not present

## 2024-05-08 DIAGNOSIS — I714 Abdominal aortic aneurysm, without rupture, unspecified: Secondary | ICD-10-CM | POA: Diagnosis not present

## 2024-05-08 DIAGNOSIS — Z1211 Encounter for screening for malignant neoplasm of colon: Secondary | ICD-10-CM | POA: Diagnosis not present

## 2024-06-08 DIAGNOSIS — Z6837 Body mass index (BMI) 37.0-37.9, adult: Secondary | ICD-10-CM | POA: Diagnosis not present

## 2024-06-08 DIAGNOSIS — Z79899 Other long term (current) drug therapy: Secondary | ICD-10-CM | POA: Diagnosis not present

## 2024-06-08 DIAGNOSIS — Z1211 Encounter for screening for malignant neoplasm of colon: Secondary | ICD-10-CM | POA: Diagnosis not present

## 2024-06-08 DIAGNOSIS — I10 Essential (primary) hypertension: Secondary | ICD-10-CM | POA: Diagnosis not present

## 2024-06-08 DIAGNOSIS — N1831 Chronic kidney disease, stage 3a: Secondary | ICD-10-CM | POA: Diagnosis not present

## 2024-06-08 DIAGNOSIS — E7800 Pure hypercholesterolemia, unspecified: Secondary | ICD-10-CM | POA: Diagnosis not present

## 2024-06-08 DIAGNOSIS — R972 Elevated prostate specific antigen [PSA]: Secondary | ICD-10-CM | POA: Diagnosis not present

## 2024-06-08 DIAGNOSIS — M51362 Other intervertebral disc degeneration, lumbar region with discogenic back pain and lower extremity pain: Secondary | ICD-10-CM | POA: Diagnosis not present

## 2024-06-08 DIAGNOSIS — I714 Abdominal aortic aneurysm, without rupture, unspecified: Secondary | ICD-10-CM | POA: Diagnosis not present

## 2024-06-08 DIAGNOSIS — R7303 Prediabetes: Secondary | ICD-10-CM | POA: Diagnosis not present

## 2024-08-05 ENCOUNTER — Other Ambulatory Visit: Payer: Self-pay | Admitting: Cardiology
